# Patient Record
Sex: Male | Born: 1950
Health system: Southern US, Community
[De-identification: ages and names within clinical notes are randomized; demographics above are authoritative.]

## PROBLEM LIST (undated history)

## (undated) DIAGNOSIS — I10 Essential (primary) hypertension: Secondary | ICD-10-CM

## (undated) HISTORY — DX: Essential (primary) hypertension: I10

---

## 1998-02-19 ENCOUNTER — Emergency Department (HOSPITAL_COMMUNITY): Admission: EM | Admit: 1998-02-19 | Discharge: 1998-02-19 | Payer: Self-pay | Admitting: Emergency Medicine

## 1998-03-19 ENCOUNTER — Emergency Department (HOSPITAL_COMMUNITY): Admission: EM | Admit: 1998-03-19 | Discharge: 1998-03-19 | Payer: Self-pay | Admitting: Emergency Medicine

## 2000-01-12 ENCOUNTER — Emergency Department (HOSPITAL_COMMUNITY): Admission: EM | Admit: 2000-01-12 | Discharge: 2000-01-12 | Payer: Self-pay | Admitting: Internal Medicine

## 2002-04-17 ENCOUNTER — Emergency Department (HOSPITAL_COMMUNITY): Admission: EM | Admit: 2002-04-17 | Discharge: 2002-04-17 | Payer: Self-pay | Admitting: Emergency Medicine

## 2004-08-10 ENCOUNTER — Emergency Department (HOSPITAL_COMMUNITY): Admission: EM | Admit: 2004-08-10 | Discharge: 2004-08-10 | Payer: Self-pay | Admitting: Emergency Medicine

## 2014-04-23 ENCOUNTER — Other Ambulatory Visit: Payer: Self-pay | Admitting: Family Medicine

## 2014-04-29 ENCOUNTER — Ambulatory Visit: Payer: Self-pay | Attending: Internal Medicine | Admitting: Internal Medicine

## 2014-04-29 ENCOUNTER — Encounter: Payer: Self-pay | Admitting: Internal Medicine

## 2014-04-29 VITALS — BP 139/68 | HR 70 | Temp 98.0°F | Resp 20 | Ht 68.0 in | Wt 186.6 lb

## 2014-04-29 DIAGNOSIS — R300 Dysuria: Secondary | ICD-10-CM | POA: Insufficient documentation

## 2014-04-29 DIAGNOSIS — I10 Essential (primary) hypertension: Secondary | ICD-10-CM | POA: Insufficient documentation

## 2014-04-29 MED ORDER — HYDROCHLOROTHIAZIDE 25 MG PO TABS: 25.0000 mg | ORAL_TABLET | Freq: Every day | ORAL | Status: DC

## 2014-04-29 MED ORDER — DOXYCYCLINE HYCLATE 100 MG PO TABS: 100.0000 mg | ORAL_TABLET | Freq: Two times a day (BID) | ORAL | Status: DC

## 2014-04-29 MED ORDER — AMLODIPINE BESYLATE 2.5 MG PO TABS: 2.5000 mg | ORAL_TABLET | Freq: Every day | ORAL | Status: DC

## 2014-04-29 MED ORDER — CEFTRIAXONE SODIUM 1 G IJ SOLR
250.0000 mg | Freq: Once | INTRAMUSCULAR | Status: AC
  Administered 2014-04-29: 250 mg via INTRAMUSCULAR

## 2014-04-29 NOTE — Progress Notes (Signed)
Patient ID: COLBERT CURENTON, male   DOB: Apr 02, 1951, 63 y.o.   MRN: [id number]   Koda Routon, is a 64 y.o. male  JYN:[id number]  [id number]:[id number]  DOB - 11/08/1950  CC:  Chief Complaint  Patient presents with  . Establish Care  . Hypertension       HPI: Hanley Woerner is a 63 y.o. male here today to establish medical care.  Patient reports that he has a past medical history of hypertension and has been taking norvasc 2.5 mg and 25 mg of HCTZ.  Patient reports that he did well with his medication.  He c/o of a clear discharge, pain, and burning with bowel movements.   Patient reports that he has clear discharge with urination and pain as well. Patient has not had a colonoscopy but reports that he is unable to afford it at this time.  Patient reports that he went to health department last month and was given two pills to take but it did not clear his discharge.      Patient has No headache, No chest pain, No abdominal pain - No Nausea, No new weakness tingling or numbness, No Cough - SOB.  No Known Allergies Past Medical History  Diagnosis Date  . Hypertension    No current outpatient prescriptions on file prior to visit.   No current facility-administered medications on file prior to visit.   Family History  Problem Relation Age of Onset  . Hypertension Mother    History   Social History  . Marital Status: Single    Spouse Name: N/A    Number of Children: N/A  . Years of Education: N/A   Occupational History  . Not on file.   Social History Main Topics  . Smoking status: Never Smoker   . Smokeless tobacco: Not on file  . Alcohol Use: 3.0 oz/week    6 drink(s) per week  . Drug Use: No  . Sexual Activity: Not on file   Other Topics Concern  . Not on file   Social History Narrative  . No narrative on file   Review of Systems  HENT: Negative.   Eyes: Negative.   Respiratory: Negative.   Cardiovascular: Negative.   Gastrointestinal: Negative.     Genitourinary: Negative.   Musculoskeletal: Negative.   Neurological: Negative.       Objective:   Filed Vitals:   04/29/14 1130  BP: 139/68  Pulse: 70  Temp: 98 F (36.7 C)  Resp: 20   Physical Exam  Vitals reviewed. Constitutional: He is oriented to person, place, and time. He appears well-nourished.  HENT:  Right Ear: External ear normal.  Left Ear: External ear normal.  Mouth/Throat: Oropharynx is clear and moist.  Eyes: EOM are normal. Pupils are equal, round, and reactive to light.  Neck: Normal range of motion. Neck supple.  Cardiovascular: Normal rate, regular rhythm and normal heart sounds.   Pulmonary/Chest: Effort normal and breath sounds normal.  Abdominal: Soft. Bowel sounds are normal.  Genitourinary: Penis normal. Right testis shows no tenderness. Left testis shows no tenderness. No discharge (no discharge noted. Fishy odor noted) found.  Musculoskeletal: Normal range of motion. He exhibits no edema and no tenderness.  Lymphadenopathy: No inguinal adenopathy noted on the right or left side.  Neurological: He is alert and oriented to person, place, and time. He has normal reflexes.  Skin: He is not diaphoretic.     No results found for this basename: WBC, HGB, HCT, MCV, PLT  No results found for this basename: CREATININE, BUN, NA, K, CL, CO2    No results found for this basename: HGBA1C   Lipid Panel  No results found for this basename: chol, trig, hdl, cholhdl, vldl, ldlcalc       Assessment and plan:   [name] was seen today for establish care and hypertension.  Diagnoses and associated orders for this visit:  Essential hypertension - amLODipine (NORVASC) 2.5 MG tablet; Take 1 tablet (2.5 mg total) by mouth daily. - hydrochlorothiazide (HYDRODIURIL) 25 MG tablet; Take 1 tablet (25 mg total) by mouth daily. - Lipid panel; Future - CBC; Future - COMPLETE METABOLIC PANEL WITH GFR; Future - TSH; Future  Dysuria - cefTRIAXone (ROCEPHIN)  injection 250 mg; Inject 0.25 g (250 mg total) into the muscle once. - doxycycline (VIBRA-TABS) 100 MG tablet; Take 1 tablet (100 mg total) by mouth 2 (two) times daily. - Cytology (oral, anal, urethral) ancillary only - HIV antibody (with reflex); Future - RPR; Future   Return in about 1 week (around 05/06/2014) for Lab Visit and 6 mo PCP for HTN.    Holland CommonsKECK, Tavonte Seybold, NP-C [facility] [phone] 04/29/2014, 11:50 AM

## 2014-04-29 NOTE — Patient Instructions (Signed)
DASH Eating Plan  DASH stands for "Dietary Approaches to Stop Hypertension." The DASH eating plan is a healthy eating plan that has been shown to reduce high blood pressure (hypertension). Additional health benefits may include reducing the risk of type 2 diabetes mellitus, heart disease, and stroke. The DASH eating plan may also help with weight loss.  WHAT DO I NEED TO KNOW ABOUT THE DASH EATING PLAN?  For the DASH eating plan, you will follow these general guidelines:  · Choose foods with a percent daily value for sodium of less than 5% (as listed on the food label).  · Use salt-free seasonings or herbs instead of table salt or sea salt.  · Check with your health care provider or pharmacist before using salt substitutes.  · Eat lower-sodium products, often labeled as "lower sodium" or "no salt added."  · Eat fresh foods.  · Eat more vegetables, fruits, and low-fat dairy products.  · Choose whole grains. Look for the word "whole" as the first word in the ingredient list.  · Choose fish and skinless chicken or turkey more often than red meat. Limit fish, poultry, and meat to 6 oz (170 g) each day.  · Limit sweets, desserts, sugars, and sugary drinks.  · Choose heart-healthy fats.  · Limit cheese to 1 oz (28 g) per day.  · Eat more home-cooked food and less restaurant, buffet, and fast food.  · Limit fried foods.  · Cook foods using methods other than frying.  · Limit canned vegetables. If you do use them, rinse them well to decrease the sodium.  · When eating at a restaurant, ask that your food be prepared with less salt, or no salt if possible.  WHAT FOODS CAN I EAT?  Seek help from a dietitian for individual calorie needs.  Grains  Whole grain or whole wheat bread. Brown rice. Whole grain or whole wheat pasta. Quinoa, bulgur, and whole grain cereals. Low-sodium cereals. Corn or whole wheat flour tortillas. Whole grain cornbread. Whole grain crackers. Low-sodium crackers.  Vegetables  Fresh or frozen vegetables  (raw, steamed, roasted, or grilled). Low-sodium or reduced-sodium tomato and vegetable juices. Low-sodium or reduced-sodium tomato sauce and paste. Low-sodium or reduced-sodium canned vegetables.   Fruits  All fresh, canned (in natural juice), or frozen fruits.  Meat and Other Protein Products  Ground beef (85% or leaner), grass-fed beef, or beef trimmed of fat. Skinless chicken or turkey. Ground chicken or turkey. Pork trimmed of fat. All fish and seafood. Eggs. Dried beans, peas, or lentils. Unsalted nuts and seeds. Unsalted canned beans.  Dairy  Low-fat dairy products, such as skim or 1% milk, 2% or reduced-fat cheeses, low-fat ricotta or cottage cheese, or plain low-fat yogurt. Low-sodium or reduced-sodium cheeses.  Fats and Oils  Tub margarines without trans fats. Light or reduced-fat mayonnaise and salad dressings (reduced sodium). Avocado. Safflower, olive, or canola oils. Natural peanut or almond butter.  Other  Unsalted popcorn and pretzels.  The items listed above may not be a complete list of recommended foods or beverages. Contact your dietitian for more options.  WHAT FOODS ARE NOT RECOMMENDED?  Grains  White bread. White pasta. White rice. Refined cornbread. Bagels and croissants. Crackers that contain trans fat.  Vegetables  Creamed or fried vegetables. Vegetables in a cheese sauce. Regular canned vegetables. Regular canned tomato sauce and paste. Regular tomato and vegetable juices.  Fruits  Dried fruits. Canned fruit in light or heavy syrup. Fruit juice.  Meat and Other Protein   Products  Fatty cuts of meat. Ribs, chicken wings, bacon, sausage, bologna, salami, chitterlings, fatback, hot dogs, bratwurst, and packaged luncheon meats. Salted nuts and seeds. Canned beans with salt.  Dairy  Whole or 2% milk, cream, half-and-half, and cream cheese. Whole-fat or sweetened yogurt. Full-fat cheeses or blue cheese. Nondairy creamers and whipped toppings. Processed cheese, cheese spreads, or cheese  curds.  Condiments  Onion and garlic salt, seasoned salt, table salt, and sea salt. Canned and packaged gravies. Worcestershire sauce. Tartar sauce. Barbecue sauce. Teriyaki sauce. Soy sauce, including reduced sodium. Steak sauce. Fish sauce. Oyster sauce. Cocktail sauce. Horseradish. Ketchup and mustard. Meat flavorings and tenderizers. Bouillon cubes. Hot sauce. Tabasco sauce. Marinades. Taco seasonings. Relishes.  Fats and Oils  Butter, stick margarine, lard, shortening, ghee, and bacon fat. Coconut, palm kernel, or palm oils. Regular salad dressings.  Other  Pickles and olives. Salted popcorn and pretzels.  The items listed above may not be a complete list of foods and beverages to avoid. Contact your dietitian for more information.  WHERE CAN I FIND MORE INFORMATION?  National Heart, Lung, and Blood Institute: www.nhlbi.nih.gov/health/health-topics/topics/dash/  Document Released: 09/16/2011 Document Revised: 10/02/2013 Document Reviewed: 08/01/2013  ExitCare® Patient Information ©2015 [employer]. This information is not intended to replace advice given to you by your health care provider. Make sure you discuss any questions you have with your health care provider.

## 2014-04-29 NOTE — Progress Notes (Signed)
Patient presents to establish care for HTN Ran out of meds 2 days ago. Requesting med refills.  States 1-2 month history of burning with bowel movement as well as clear discharge

## 2014-05-01 ENCOUNTER — Telehealth: Payer: Self-pay | Admitting: *Deleted

## 2014-05-01 NOTE — Telephone Encounter (Signed)
Message copied by Fredderick SeveranceUCATTE, Vincent Streater L on Wed May 01, 2014 11:30 AM ------      Message from: Holland CommonsKECK, VALERIE A      Created: Tue Apr 30, 2014  7:15 PM       Let patient know he was negative for GC, chlamydia, and Trichomonas. He has been treated for everything regardless on last visit. ------

## 2014-05-01 NOTE — Telephone Encounter (Signed)
Patient notified of lab results

## 2014-05-06 ENCOUNTER — Ambulatory Visit: Payer: Self-pay | Attending: Internal Medicine | Admitting: *Deleted

## 2014-05-06 DIAGNOSIS — R300 Dysuria: Secondary | ICD-10-CM

## 2014-05-06 DIAGNOSIS — I10 Essential (primary) hypertension: Secondary | ICD-10-CM

## 2014-05-06 LAB — COMPLETE METABOLIC PANEL WITH GFR
ALK PHOS: 72 U/L (ref 39–117)
ALT: 35 U/L (ref 0–53)
AST: 20 U/L (ref 0–37)
Albumin: 3.9 g/dL (ref 3.5–5.2)
BUN: 12 mg/dL (ref 6–23)
CO2: 29 mEq/L (ref 19–32)
Calcium: 9.4 mg/dL (ref 8.4–10.5)
Chloride: 102 mEq/L (ref 96–112)
Creat: 1.06 mg/dL (ref 0.50–1.35)
GFR, EST NON AFRICAN AMERICAN: 75 mL/min
GFR, Est African American: 87 mL/min
GLUCOSE: 107 mg/dL — AB (ref 70–99)
Potassium: 3.7 mEq/L (ref 3.5–5.3)
SODIUM: 140 meq/L (ref 135–145)
Total Bilirubin: 0.4 mg/dL (ref 0.2–1.2)
Total Protein: 6.9 g/dL (ref 6.0–8.3)

## 2014-05-06 LAB — LIPID PANEL
CHOL/HDL RATIO: 4 ratio
CHOLESTEROL: 185 mg/dL (ref 0–200)
HDL: 46 mg/dL (ref >39)
LDL Cholesterol: 109 mg/dL — ABNORMAL HIGH (ref 0–99)
TRIGLYCERIDES: 149 mg/dL (ref <150)
VLDL: 30 mg/dL (ref 0–40)

## 2014-05-06 LAB — CBC
HEMATOCRIT: 37.9 % — AB (ref 39.0–52.0)
HEMOGLOBIN: 13.4 g/dL (ref 13.0–17.0)
MCH: 32.3 pg (ref 26.0–34.0)
MCHC: 35.4 g/dL (ref 30.0–36.0)
MCV: 91.3 fL (ref 78.0–100.0)
Platelets: 280 10*3/uL (ref 150–400)
RBC: 4.15 MIL/uL — ABNORMAL LOW (ref 4.22–5.81)
RDW: 12.6 % (ref 11.5–15.5)
WBC: 4.5 10*3/uL (ref 4.0–10.5)

## 2014-05-06 LAB — TSH: TSH: 0.718 u[IU]/mL (ref 0.350–4.500)

## 2014-05-07 LAB — RPR

## 2014-05-07 LAB — HIV ANTIBODY (ROUTINE TESTING W REFLEX): HIV 1&2 Ab, 4th Generation: NONREACTIVE

## 2014-05-09 ENCOUNTER — Telehealth: Payer: Self-pay | Admitting: Internal Medicine

## 2014-05-09 NOTE — Telephone Encounter (Signed)
Returned patient's call. Informed patient his HIV and RPR is negative. Patient educated about the need to use condoms every time he has intercourse. Patient also notified his other labs are normal except cholesterol is slightly elevated. Educated patient  on diet and exercise. Patient instructed about a low fat diet and exercising at least 3 days a week. Patient verbalized he needs a copy of his test results [facility] Patient informed he can come and pick up his results. Patient verbalized understanding. Annamaria Hellingose,Chinelo Benn Renee, RN

## 2014-05-09 NOTE — Telephone Encounter (Signed)
Pt. Came in requesting a copy of lab results from 05/06/14...Michael Mooney.please call patient with results as well as when he can come in to pick up copy.

## 2014-05-27 ENCOUNTER — Telehealth: Payer: Self-pay | Admitting: Internal Medicine

## 2014-05-27 NOTE — Telephone Encounter (Signed)
Pt has come in today to see if he can get a call from the nurse concerning his last lab results; please f/u with patients via mobile phone

## 2014-05-29 ENCOUNTER — Telehealth: Payer: Self-pay | Admitting: Internal Medicine

## 2014-05-29 NOTE — Telephone Encounter (Signed)
Pt. Called to concerning his blood work results. Please f/u with pt.

## 2014-05-30 NOTE — Telephone Encounter (Signed)
Spoke with patient. Patient does have a copy of all his labs. Annamaria Hellingose,Arayna Illescas Renee, RN

## 2014-07-11 ENCOUNTER — Ambulatory Visit: Payer: Self-pay | Admitting: Internal Medicine

## 2014-07-16 ENCOUNTER — Encounter: Payer: Self-pay | Admitting: Internal Medicine

## 2014-07-16 NOTE — Progress Notes (Signed)
Pt came to the office for his appointment. He came late and we were unable to see him. I spoke with the pt and he claimed to have palpitations for a week.  I instructed the pt that he should go straight to the UC. I let him know that this was a serious matter and that he really needed to go to the UC right now.

## 2014-07-17 ENCOUNTER — Encounter: Payer: Self-pay | Admitting: *Deleted

## 2014-07-17 NOTE — Progress Notes (Signed)
Pt came into the office yesterday and was too late for his appointment so we were unable to see him. I did question why he was coming into office that day and he stated that he was having palpitations. I instructed the pt that he needed to go the the UC right away. Pt states that he would go and I told him that this was a serious matter. He said that he would go straight there.

## 2014-07-22 ENCOUNTER — Ambulatory Visit: Payer: Self-pay | Attending: Internal Medicine | Admitting: Internal Medicine

## 2014-07-22 ENCOUNTER — Encounter: Payer: Self-pay | Admitting: Internal Medicine

## 2014-07-22 VITALS — BP 138/78 | HR 83 | Temp 98.6°F | Ht 68.0 in | Wt 184.0 lb

## 2014-07-22 DIAGNOSIS — Z005 Encounter for examination of potential donor of organ and tissue: Secondary | ICD-10-CM | POA: Insufficient documentation

## 2014-07-22 DIAGNOSIS — R000 Tachycardia, unspecified: Secondary | ICD-10-CM | POA: Insufficient documentation

## 2014-07-22 DIAGNOSIS — Z2821 Immunization not carried out because of patient refusal: Secondary | ICD-10-CM | POA: Insufficient documentation

## 2014-07-22 NOTE — Progress Notes (Signed)
Patient ID: Michael LentCharles J Mooney, male   DOB: 09/28/1951, 63 y.o.   MRN: [id number]  CC: High pulse rate  HPI:  Patient states that the past two times he has been to donate plasma he has had a high pulse rate.  He was told that he needs to be evaluated by his PCP before donation can take place.  He admits to drinking coffee and walking from the bus stop to donation location before his vitals are taken.  He reports that he usually checks his blood pressure at home and they usually range from the 118 to 120s systolic.    No Known Allergies Past Medical History  Diagnosis Date  . Hypertension    Current Outpatient Prescriptions on File Prior to Visit  Medication Sig Dispense Refill  . amLODipine (NORVASC) 2.5 MG tablet Take 1 tablet (2.5 mg total) by mouth daily.  30 tablet  6  . doxycycline (VIBRA-TABS) 100 MG tablet Take 1 tablet (100 mg total) by mouth 2 (two) times daily.  14 tablet  0  . hydrochlorothiazide (HYDRODIURIL) 25 MG tablet Take 1 tablet (25 mg total) by mouth daily.  30 tablet  6   No current facility-administered medications on file prior to visit.   Family History  Problem Relation Age of Onset  . Hypertension Mother    History   Social History  . Marital Status: Single    Spouse Name: N/A    Number of Children: N/A  . Years of Education: N/A   Occupational History  . Not on file.   Social History Main Topics  . Smoking status: Never Smoker   . Smokeless tobacco: Not on file  . Alcohol Use: 3.0 oz/week    6 drink(s) per week  . Drug Use: No  . Sexual Activity: Not on file   Other Topics Concern  . Not on file   Social History Narrative  . No narrative on file    Review of Systems  Eyes: Negative for blurred vision.  Respiratory: Negative.   Cardiovascular: Negative.   Neurological: Negative for dizziness and headaches.      Objective:   Filed Vitals:   07/22/14 1210  BP: 167/72  Pulse: 83  Temp: 98.6 F (37 C)   Physical Exam   Constitutional: He is oriented to person, place, and time.  Cardiovascular: Normal rate, regular rhythm and normal heart sounds.   No murmur heard. Pulmonary/Chest: Effort normal and breath sounds normal.  Neurological: He is alert and oriented to person, place, and time.  Skin: Skin is warm and dry.     Lab Results  Component Value Date   WBC 4.5 05/06/2014   HGB 13.4 05/06/2014   HCT 37.9* 05/06/2014   MCV 91.3 05/06/2014   PLT 280 05/06/2014   Lab Results  Component Value Date   CREATININE 1.06 05/06/2014   BUN 12 05/06/2014   NA 140 05/06/2014   K 3.7 05/06/2014   CL 102 05/06/2014   CO2 29 05/06/2014    No results found for this basename: HGBA1C   Lipid Panel     Component Value Date/Time   CHOL 185 05/06/2014 1001   TRIG 149 05/06/2014 1001   HDL 46 05/06/2014 1001   CHOLHDL 4.0 05/06/2014 1001   VLDL 30 05/06/2014 1001   LDLCALC 109* 05/06/2014 1001       Assessment and plan:   [name] was seen today for high pulse rate.  Diagnoses and associated orders for this visit:  Tachycardia Patient's heart rate is within normal limits today.  Patient's heart rate is within normal limits on each visit. Patient will sustain from drinking coffee on days of donation and sit for at least 10 minutes before vitals are taken. Patient cleared to continue with plasma donation  Refused influenza vaccine Educated patient on benefits of influenza vaccination   Return if symptoms worsen or fail to improve.       Holland CommonsKECK, VALERIE, NP-C [facility] [phone] 07/22/2014, 12:19 PM

## 2014-07-22 NOTE — Progress Notes (Signed)
patient says that the last couple of times that he has gone to give blood his pulse rate has been 102 or 103 and he was unable to give,  Today it is 83.

## 2014-12-16 ENCOUNTER — Other Ambulatory Visit: Payer: Self-pay | Admitting: Internal Medicine

## 2014-12-17 ENCOUNTER — Other Ambulatory Visit: Payer: Self-pay | Admitting: Internal Medicine

## 2014-12-18 ENCOUNTER — Ambulatory Visit: Payer: Self-pay | Attending: Internal Medicine | Admitting: Internal Medicine

## 2014-12-18 ENCOUNTER — Encounter: Payer: Self-pay | Admitting: Internal Medicine

## 2014-12-18 VITALS — BP 121/73 | HR 72 | Temp 98.6°F | Resp 16 | Ht 68.0 in | Wt 187.0 lb

## 2014-12-18 DIAGNOSIS — I10 Essential (primary) hypertension: Secondary | ICD-10-CM

## 2014-12-18 MED ORDER — HYDROCHLOROTHIAZIDE 25 MG PO TABS: 25.0000 mg | ORAL_TABLET | Freq: Every day | ORAL | Status: DC

## 2014-12-18 MED ORDER — AMLODIPINE BESYLATE 5 MG PO TABS: 2.5000 mg | ORAL_TABLET | Freq: Every day | ORAL | Status: DC

## 2014-12-18 NOTE — Patient Instructions (Signed)
DASH Eating Plan °DASH stands for "Dietary Approaches to Stop Hypertension." The DASH eating plan is a healthy eating plan that has been shown to reduce high blood pressure (hypertension). Additional health benefits may include reducing the risk of type 2 diabetes mellitus, heart disease, and stroke. The DASH eating plan may also help with weight loss. °WHAT DO I NEED TO KNOW ABOUT THE DASH EATING PLAN? °For the DASH eating plan, you will follow these general guidelines: °· Choose foods with a percent daily value for sodium of less than 5% (as listed on the food label). °· Use salt-free seasonings or herbs instead of table salt or sea salt. °· Check with your health care provider or pharmacist before using salt substitutes. °· Eat lower-sodium products, often labeled as "lower sodium" or "no salt added." °· Eat fresh foods. °· Eat more vegetables, fruits, and low-fat dairy products. °· Choose whole grains. Look for the word "whole" as the first word in the ingredient list. °· Choose fish and skinless chicken or turkey more often than red meat. Limit fish, poultry, and meat to 6 oz (170 g) each day. °· Limit sweets, desserts, sugars, and sugary drinks. °· Choose heart-healthy fats. °· Limit cheese to 1 oz (28 g) per day. °· Eat more home-cooked food and less restaurant, buffet, and fast food. °· Limit fried foods. °· Cook foods using methods other than frying. °· Limit canned vegetables. If you do use them, rinse them well to decrease the sodium. °· When eating at a restaurant, ask that your food be prepared with less salt, or no salt if possible. °WHAT FOODS CAN I EAT? °Seek help from a dietitian for individual calorie needs. °Grains °Whole grain or whole wheat bread. Brown rice. Whole grain or whole wheat pasta. Quinoa, bulgur, and whole grain cereals. Low-sodium cereals. Corn or whole wheat flour tortillas. Whole grain cornbread. Whole grain crackers. Low-sodium crackers. °Vegetables °Fresh or frozen vegetables  (raw, steamed, roasted, or grilled). Low-sodium or reduced-sodium tomato and vegetable juices. Low-sodium or reduced-sodium tomato sauce and paste. Low-sodium or reduced-sodium canned vegetables.  °Fruits °All fresh, canned (in natural juice), or frozen fruits. °Meat and Other Protein Products °Ground beef (85% or leaner), grass-fed beef, or beef trimmed of fat. Skinless chicken or turkey. Ground chicken or turkey. Pork trimmed of fat. All fish and seafood. Eggs. Dried beans, peas, or lentils. Unsalted nuts and seeds. Unsalted canned beans. °Dairy °Low-fat dairy products, such as skim or 1% milk, 2% or reduced-fat cheeses, low-fat ricotta or cottage cheese, or plain low-fat yogurt. Low-sodium or reduced-sodium cheeses. °Fats and Oils °Tub margarines without trans fats. Light or reduced-fat mayonnaise and salad dressings (reduced sodium). Avocado. Safflower, olive, or canola oils. Natural peanut or almond butter. °Other °Unsalted popcorn and pretzels. °The items listed above may not be a complete list of recommended foods or beverages. Contact your dietitian for more options. °WHAT FOODS ARE NOT RECOMMENDED? °Grains °White bread. White pasta. White rice. Refined cornbread. Bagels and croissants. Crackers that contain trans fat. °Vegetables °Creamed or fried vegetables. Vegetables in a cheese sauce. Regular canned vegetables. Regular canned tomato sauce and paste. Regular tomato and vegetable juices. °Fruits °Dried fruits. Canned fruit in light or heavy syrup. Fruit juice. °Meat and Other Protein Products °Fatty cuts of meat. Ribs, chicken wings, bacon, sausage, bologna, salami, chitterlings, fatback, hot dogs, bratwurst, and packaged luncheon meats. Salted nuts and seeds. Canned beans with salt. °Dairy °Whole or 2% milk, cream, half-and-half, and cream cheese. Whole-fat or sweetened yogurt. Full-fat   cheeses or blue cheese. Nondairy creamers and whipped toppings. Processed cheese, cheese spreads, or cheese  curds. °Condiments °Onion and garlic salt, seasoned salt, table salt, and sea salt. Canned and packaged gravies. Worcestershire sauce. Tartar sauce. Barbecue sauce. Teriyaki sauce. Soy sauce, including reduced sodium. Steak sauce. Fish sauce. Oyster sauce. Cocktail sauce. Horseradish. Ketchup and mustard. Meat flavorings and tenderizers. Bouillon cubes. Hot sauce. Tabasco sauce. Marinades. Taco seasonings. Relishes. °Fats and Oils °Butter, stick margarine, lard, shortening, ghee, and bacon fat. Coconut, palm kernel, or palm oils. Regular salad dressings. °Other °Pickles and olives. Salted popcorn and pretzels. °The items listed above may not be a complete list of foods and beverages to avoid. Contact your dietitian for more information. °WHERE CAN I FIND MORE INFORMATION? °National Heart, Lung, and Blood Institute: www.nhlbi.nih.gov/health/health-topics/topics/dash/ °Document Released: 09/16/2011 Document Revised: 02/11/2014 Document Reviewed: 08/01/2013 °ExitCare® Patient Information ©2015 [employer]. This information is not intended to replace advice given to you by your health care provider. Make sure you discuss any questions you have with your health care provider. ° °

## 2014-12-18 NOTE — Progress Notes (Signed)
Pt is here following up on his HTN. Pt has no C.C. Today. 

## 2014-12-18 NOTE — Progress Notes (Signed)
Patient ID: Michael Mooney, male   DOB: 10/01/1951, 64 y.o.   MRN: [id number] Subjective:  Michael Mooney is a 64 y.o. male with hypertension. Current Outpatient Prescriptions  Medication Sig Dispense Refill  . amLODipine (NORVASC) 5 MG tablet TAKE 1/2 TABLET BY MOUTH DAILY 15 tablet 6  . hydrochlorothiazide (HYDRODIURIL) 25 MG tablet TAKE 1 TABLET BY MOUTH DAILY. 30 tablet 6  . doxycycline (VIBRA-TABS) 100 MG tablet Take 1 tablet (100 mg total) by mouth 2 (two) times daily. (Patient not taking: Reported on 12/18/2014) 14 tablet 0   No current facility-administered medications for this visit.    Hypertension ROS: taking medications as instructed, no medication side effects noted, no TIA's, no chest pain on exertion, no dyspnea on exertion, no swelling of ankles and no palpitations.  New concerns: NONE  Objective:  BP 121/73 mmHg  Pulse 72  Temp(Src) 98.6 F (37 C) (Oral)  Resp 16  Ht 5\' 8"  (1.727 m)  Wt 187 lb (84.823 kg)  BMI 28.44 kg/m2  SpO2 95%  Appearance alert, well appearing, and in no distress and oriented to person, place, and time. General exam BP noted to be well controlled today in office, S1, S2 normal, no gallop, no murmur, chest clear, no JVD, no HSM, no edema, CVS exam  - normal rate, regular rhythm, normal S1, S2, no murmurs, rubs, clicks or gallops, normal bilateral carotid upstroke without bruits, no JVD.  Lab review: labs are reviewed, up to date and normal.   Assessment:   Hypertension well controlled.   Plan:  Current treatment plan is effective, no change in therapy. Reviewed diet, exercise and weight control. Cardiovascular risk and specific lipid/LDL goals reviewed. Follow up: 6 months and as needed.Marland Kitchen.  Holland CommonsKECK, VALERIE, NP 12/18/2014 3:21 PM

## 2015-01-08 ENCOUNTER — Ambulatory Visit: Payer: Self-pay

## 2015-01-10 ENCOUNTER — Ambulatory Visit: Payer: Self-pay

## 2015-10-30 MED FILL — HYDROCHLOROTHIAZIDE 25 MG T: 25 | 30 days supply | Qty: 30 | Fill #3

## 2015-10-30 MED FILL — AMLODIPINE BESYLATE 5 MG TA: 5 | 30 days supply | Qty: 15 | Fill #9

## 2015-11-03 ENCOUNTER — Other Ambulatory Visit: Payer: Self-pay | Admitting: Internal Medicine

## 2015-11-28 MED FILL — AMLODIPINE BESYLATE 5 MG TA: 5 | 30 days supply | Qty: 15 | Fill #10

## 2015-11-28 MED FILL — HYDROCHLOROTHIAZIDE 25 MG T: 25 | 30 days supply | Qty: 30 | Fill #4

## 2016-01-01 ENCOUNTER — Other Ambulatory Visit: Payer: Self-pay | Admitting: Internal Medicine

## 2016-01-02 MED FILL — AMLODIPINE BESYLATE 5 MG TA: 5 | 30 days supply | Qty: 15 | Fill #0

## 2016-01-02 MED FILL — HYDROCHLOROTHIAZIDE 25 MG T: 25 | 30 days supply | Qty: 30 | Fill #0

## 2016-02-02 ENCOUNTER — Other Ambulatory Visit: Payer: Self-pay | Admitting: Internal Medicine

## 2016-02-04 ENCOUNTER — Other Ambulatory Visit: Payer: Self-pay | Admitting: Internal Medicine

## 2016-02-18 ENCOUNTER — Ambulatory Visit: Payer: Self-pay | Attending: Family Medicine | Admitting: Family Medicine

## 2016-02-18 ENCOUNTER — Encounter: Payer: Self-pay | Admitting: Family Medicine

## 2016-02-18 VITALS — BP 146/75 | HR 76 | Temp 98.3°F | Resp 16 | Ht 68.0 in | Wt 182.0 lb

## 2016-02-18 DIAGNOSIS — Z1211 Encounter for screening for malignant neoplasm of colon: Secondary | ICD-10-CM

## 2016-02-18 DIAGNOSIS — I10 Essential (primary) hypertension: Secondary | ICD-10-CM | POA: Insufficient documentation

## 2016-02-18 DIAGNOSIS — Z79899 Other long term (current) drug therapy: Secondary | ICD-10-CM | POA: Insufficient documentation

## 2016-02-18 LAB — COMPLETE METABOLIC PANEL WITH GFR
ALT: 29 U/L (ref 9–46)
AST: 26 U/L (ref 10–35)
Albumin: 4.1 g/dL (ref 3.6–5.1)
Alkaline Phosphatase: 63 U/L (ref 40–115)
BILIRUBIN TOTAL: 0.8 mg/dL (ref 0.2–1.2)
BUN: 7 mg/dL (ref 7–25)
CALCIUM: 9.2 mg/dL (ref 8.6–10.3)
CO2: 28 mmol/L (ref 20–31)
CREATININE: 0.89 mg/dL (ref 0.70–1.25)
Chloride: 96 mmol/L — ABNORMAL LOW (ref 98–110)
GFR, Est Non African American: >89 mL/min (ref >=60)
Glucose, Bld: 104 mg/dL — ABNORMAL HIGH (ref 65–99)
Potassium: 4 mmol/L (ref 3.5–5.3)
Sodium: 136 mmol/L (ref 135–146)
TOTAL PROTEIN: 7.2 g/dL (ref 6.1–8.1)

## 2016-02-18 LAB — LIPID PANEL
CHOLESTEROL: 137 mg/dL (ref 125–200)
HDL: 54 mg/dL (ref >=40)
LDL Cholesterol: 31 mg/dL (ref <130)
TRIGLYCERIDES: 258 mg/dL — AB (ref <150)
Total CHOL/HDL Ratio: 2.5 Ratio (ref <=5.0)
VLDL: 52 mg/dL — ABNORMAL HIGH (ref <30)

## 2016-02-18 MED ORDER — AMLODIPINE BESYLATE 5 MG PO TABS: ORAL_TABLET | ORAL | Status: DC

## 2016-02-18 MED ORDER — HYDROCHLOROTHIAZIDE 25 MG PO TABS: ORAL_TABLET | ORAL | Status: DC

## 2016-02-18 MED FILL — ?AMLODIPINE BESYLATE 5 MG T: 5 | 30 days supply | Qty: 15 | Fill #0

## 2016-02-18 MED FILL — ?HYDROCHLOROTHIAZIDE 25 MG: 25 MG | 30 days supply | Qty: 30 | Fill #0

## 2016-02-18 NOTE — Progress Notes (Signed)
Subjective:  Patient ID: Michael Mooney, male    DOB: Dec 22, 1950  Age: 65 y.o. MRN: [id number]  CC: Hypertension and Establish Care   HPI Michael Mooney is a 65 year old male with history of hypertension previously followed by the nurse practitioner who comes into the clinic to establish care with me. He was last seen a year ago and has been out of all his antihypertensives hent and mildly elevated blood pressure and is requesting refills.  He has no complaints today and is not up-to-date on his colonoscopy.  Outpatient Prescriptions Prior to Visit  Medication Sig Dispense Refill  . amLODipine (NORVASC) 5 MG tablet TAKE 1/2 TABLET BY MOUTH DAILY. MUST HAVE OFFICE VISIT FOR REFILLS 15 tablet 0  . hydrochlorothiazide (HYDRODIURIL) 25 MG tablet TAKE 1 TABLET BY MOUTH DAILY. MUST HAVE OFFICE VISIT FOR REFILLS 30 tablet 0  . doxycycline (VIBRA-TABS) 100 MG tablet Take 1 tablet (100 mg total) by mouth 2 (two) times daily. (Patient not taking: Reported on 12/18/2014) 14 tablet 0   No facility-administered medications prior to visit.    ROS Review of Systems  Constitutional: Negative for activity change and appetite change.  HENT: Negative for sinus pressure and sore throat.   Eyes: Negative for visual disturbance.  Respiratory: Negative for cough, chest tightness and shortness of breath.   Cardiovascular: Negative for chest pain and leg swelling.  Gastrointestinal: Negative for abdominal pain, diarrhea, constipation and abdominal distention.  Endocrine: Negative.   Genitourinary: Negative for dysuria.  Musculoskeletal: Negative for myalgias and joint swelling.  Skin: Negative for rash.  Allergic/Immunologic: Negative.   Neurological: Negative for weakness, light-headedness and numbness.  Psychiatric/Behavioral: Negative for suicidal ideas and dysphoric mood.    Objective:  BP 146/75 mmHg  Pulse 76  Temp(Src) 98.3 F (36.8 C) (Oral)  Resp 16  Ht  (1.727 m)  Wt 182 lb  (82.555 kg)  BMI 27.68 kg/m2  SpO2 98%  BP/Weight 02/18/2016 12/18/2014 07/22/2014  Systolic BP 146 121 138  Diastolic BP 75 73 78  Wt. (Lbs) 182 187 184  BMI 27.68 28.44 27.98      Physical Exam  Constitutional: He is oriented to person, place, and time. He appears well-developed and well-nourished.  Cardiovascular: Normal rate, normal heart sounds and intact distal pulses.   No murmur heard. Pulmonary/Chest: Effort normal and breath sounds normal. He has no wheezes. He has no rales. He exhibits no tenderness.  Abdominal: Soft. Bowel sounds are normal. He exhibits no distension and no mass. There is no tenderness.  Musculoskeletal: Normal range of motion.  Neurological: He is alert and oriented to person, place, and time.  Skin: Skin is warm and dry.  Psychiatric: He has a normal mood and affect.   CMP Latest Ref Rng 05/06/2014  Glucose 70 - 99 mg/dL 409(W)  BUN 6 - 23 mg/dL 12  Creatinine 1.19 - 1.47 mg/dL 8.29  Sodium 562 - 130 mEq/L 140  Potassium 3.5 - 5.3 mEq/L 3.7  Chloride 96 - 112 mEq/L 102  CO2 19 - 32 mEq/L 29  Calcium 8.4 - 10.5 mg/dL 9.4  Total Protein 6.0 - 8.3 g/dL 6.9  Total Bilirubin 0.2 - 1.2 mg/dL 0.4  Alkaline Phos 39 - 117 U/L 72  AST 0 - 37 U/L 20  ALT 0 - 53 U/L 35      Assessment & Plan:   1. Essential hypertension Mildly elevated due to the fact that he has been out of his antihypertensives which I  have refilled today Low-sodium, DASH diet - hydrochlorothiazide (HYDRODIURIL) 25 MG tablet; TAKE 1 TABLET BY MOUTH DAILY.  Dispense: 30 tablet; Refill: 5 - amLODipine (NORVASC) 5 MG tablet; TAKE 1/2 TABLET BY MOUTH DAILY.  Dispense: 30 tablet; Refill: 5 - Lipid panel - COMPLETE METABOLIC PANEL WITH GFR  2. HCM/ Special screening for malignant neoplasms, colon - Ambulatory referral to Gastroenterology   Meds ordered this encounter  Medications  . hydrochlorothiazide (HYDRODIURIL) 25 MG tablet    Sig: TAKE 1 TABLET BY MOUTH DAILY.    Dispense:   30 tablet    Refill:  5  . amLODipine (NORVASC) 5 MG tablet    Sig: TAKE 1/2 TABLET BY MOUTH DAILY.    Dispense:  30 tablet    Refill:  5    Follow-up: Return in about 6 months (around 08/20/2016) for Follow-up on hypertension.   Jaclyn ShaggyEnobong Amao MD

## 2016-02-18 NOTE — Patient Instructions (Signed)
Hypertension Hypertension, commonly called high blood pressure, is when the force of blood pumping through your arteries is too strong. Your arteries are the blood vessels that carry blood from your heart throughout your body. A blood pressure reading consists of a higher number over a lower number, such as 110/72. The higher number (systolic) is the pressure inside your arteries when your heart pumps. The lower number (diastolic) is the pressure inside your arteries when your heart relaxes. Ideally you want your blood pressure below 120/80. Hypertension forces your heart to work harder to pump blood. Your arteries may become narrow or stiff. Having untreated or uncontrolled hypertension can cause heart attack, stroke, kidney disease, and other problems. RISK FACTORS Some risk factors for high blood pressure are controllable. Others are not.  Risk factors you cannot control include:   Race. You may be at higher risk if you are African American.  Age. Risk increases with age.  Gender. Men are at higher risk than women before age 45 years. After age 65, women are at higher risk than men. Risk factors you can control include:  Not getting enough exercise or physical activity.  Being overweight.  Getting too much fat, sugar, calories, or salt in your diet.  Drinking too much alcohol. SIGNS AND SYMPTOMS Hypertension does not usually cause signs or symptoms. Extremely high blood pressure (hypertensive crisis) may cause headache, anxiety, shortness of breath, and nosebleed. DIAGNOSIS To check if you have hypertension, your health care provider will measure your blood pressure while you are seated, with your arm held at the level of your heart. It should be measured at least twice using the same arm. Certain conditions can cause a difference in blood pressure between your right and left arms. A blood pressure reading that is higher than normal on one occasion does not mean that you need treatment. If  it is not clear whether you have high blood pressure, you may be asked to return on a different day to have your blood pressure checked again. Or, you may be asked to monitor your blood pressure at home for 1 or more weeks. TREATMENT Treating high blood pressure includes making lifestyle changes and possibly taking medicine. Living a healthy lifestyle can help lower high blood pressure. You may need to change some of your habits. Lifestyle changes may include:  Following the DASH diet. This diet is high in fruits, vegetables, and whole grains. It is low in salt, red meat, and added sugars.  Keep your sodium intake below 2,300 mg per day.  Getting at least 30-45 minutes of aerobic exercise at least 4 times per week.  Losing weight if necessary.  Not smoking.  Limiting alcoholic beverages.  Learning ways to reduce stress. Your health care provider may prescribe medicine if lifestyle changes are not enough to get your blood pressure under control, and if one of the following is true:  You are 18-59 years of age and your systolic blood pressure is above 140.  You are 60 years of age or older, and your systolic blood pressure is above 150.  Your diastolic blood pressure is above 90.  You have diabetes, and your systolic blood pressure is over 140 or your diastolic blood pressure is over 90.  You have kidney disease and your blood pressure is above 140/90.  You have heart disease and your blood pressure is above 140/90. Your personal target blood pressure may vary depending on your medical conditions, your age, and other factors. HOME CARE INSTRUCTIONS    Have your blood pressure rechecked as directed by your health care provider.   Take medicines only as directed by your health care provider. Follow the directions carefully. Blood pressure medicines must be taken as prescribed. The medicine does not work as well when you skip doses. Skipping doses also puts you at risk for  problems.  Do not smoke.   Monitor your blood pressure at home as directed by your health care provider. SEEK MEDICAL CARE IF:   You think you are having a reaction to medicines taken.  You have recurrent headaches or feel dizzy.  You have swelling in your ankles.  You have trouble with your vision. SEEK IMMEDIATE MEDICAL CARE IF:  You develop a severe headache or confusion.  You have unusual weakness, numbness, or feel faint.  You have severe chest or abdominal pain.  You vomit repeatedly.  You have trouble breathing. MAKE SURE YOU:   Understand these instructions.  Will watch your condition.  Will get help right away if you are not doing well or get worse.   This information is not intended to replace advice given to you by your health care provider. Make sure you discuss any questions you have with your health care provider.   Document Released: 09/27/2005 Document Revised: 02/11/2015 Document Reviewed: 07/20/2013 Elsevier Interactive Patient Education 2016 Elsevier Inc.  

## 2016-02-18 NOTE — Progress Notes (Signed)
Patient's here to reestablish care and f/up HTN.  Patient denies any pain today.  Patient requesting med refill.  Patient reports taking BP meds before appt.

## 2016-02-18 NOTE — Addendum Note (Signed)
Addended by: Benjamin StainBENNETT-CURSE, Sanjana Folz L on: 02/18/2016 11:33 AM   Modules accepted: Kipp BroodSmartSet

## 2016-03-16 ENCOUNTER — Other Ambulatory Visit: Payer: Self-pay | Admitting: Internal Medicine

## 2016-03-23 MED FILL — ?HYDROCHLOROTHIAZIDE 25 MG: 25 MG | 25 days supply | Qty: 25 | Fill #0

## 2016-03-23 MED FILL — ?AMLODIPINE BESYLATE 5 MG T: 5 | 60 days supply | Qty: 30 | Fill #0

## 2016-04-14 MED FILL — HYDROCHLOROTHIAZIDE 25 MG T: 25 | 25 days supply | Qty: 25 | Fill #1

## 2016-05-12 MED FILL — ?AMLODIPINE BESYLATE 5 MG T: 5 | 60 days supply | Qty: 30 | Fill #1

## 2016-05-12 MED FILL — HYDROCHLOROTHIAZIDE 25 MG T: 25 | 25 days supply | Qty: 25 | Fill #2

## 2016-06-11 MED FILL — HYDROCHLOROTHIAZIDE 25 MG T: 25 | 25 days supply | Qty: 25 | Fill #3

## 2016-07-16 MED FILL — HYDROCHLOROTHIAZIDE 25 MG T: 25 | 25 days supply | Qty: 25 | Fill #4

## 2016-08-05 MED FILL — HYDROCHLOROTHIAZIDE 25 MG T: 25 | 25 days supply | Qty: 25 | Fill #5

## 2016-08-05 MED FILL — ?AMLODIPINE BESYLATE 5 MG T: 5 | 60 days supply | Qty: 30 | Fill #2

## 2016-09-09 MED FILL — HYDROCHLOROTHIAZIDE 25 MG T: 25 | 25 days supply | Qty: 25 | Fill #6

## 2016-10-06 ENCOUNTER — Other Ambulatory Visit: Payer: Self-pay | Admitting: Internal Medicine

## 2016-10-18 MED FILL — ?AMLODIPINE BESYLATE 5 MG T: 5 | 60 days supply | Qty: 30 | Fill #3

## 2016-10-18 MED FILL — HYDROCHLOROTHIAZIDE 25 MG T: 25 | 30 days supply | Qty: 30 | Fill #0

## 2016-11-15 MED FILL — HYDROCHLOROTHIAZIDE 25 MG T: 25 | 5 days supply | Qty: 5 | Fill #7

## 2016-11-22 ENCOUNTER — Other Ambulatory Visit: Payer: Self-pay | Admitting: Family Medicine

## 2016-11-29 ENCOUNTER — Other Ambulatory Visit: Payer: Self-pay | Admitting: Family Medicine

## 2016-11-29 DIAGNOSIS — I10 Essential (primary) hypertension: Secondary | ICD-10-CM

## 2016-11-30 MED FILL — HYDROCHLOROTHIAZIDE 25 MG T: 25 | 30 days supply | Qty: 30 | Fill #0

## 2016-11-30 NOTE — Telephone Encounter (Signed)
Pt. Came into facility requesting a refill on hydrochlorothiazide (HYDRODIURIL) 25 MG tablet  Pt. Was informed to call back Monday to schedule a f/u appt. With PCP. Pt. [facility]. Please f/u.

## 2016-12-29 MED FILL — ?AMLODIPINE BESYLATE 5 MG T: 5 | 60 days supply | Qty: 30 | Fill #4

## 2016-12-30 ENCOUNTER — Other Ambulatory Visit: Payer: Self-pay | Admitting: Family Medicine

## 2016-12-30 DIAGNOSIS — I10 Essential (primary) hypertension: Secondary | ICD-10-CM

## 2017-02-14 ENCOUNTER — Other Ambulatory Visit: Payer: Self-pay

## 2017-02-14 ENCOUNTER — Ambulatory Visit: Payer: Self-pay | Attending: Family Medicine | Admitting: Family Medicine

## 2017-02-14 VITALS — BP 149/71 | HR 89 | Temp 98.2°F | Resp 18 | Ht 68.0 in | Wt 182.8 lb

## 2017-02-14 DIAGNOSIS — Z79899 Other long term (current) drug therapy: Secondary | ICD-10-CM | POA: Insufficient documentation

## 2017-02-14 DIAGNOSIS — Z0000 Encounter for general adult medical examination without abnormal findings: Secondary | ICD-10-CM

## 2017-02-14 DIAGNOSIS — I10 Essential (primary) hypertension: Secondary | ICD-10-CM

## 2017-02-14 DIAGNOSIS — R9431 Abnormal electrocardiogram [ECG] [EKG]: Secondary | ICD-10-CM

## 2017-02-14 DIAGNOSIS — Z23 Encounter for immunization: Secondary | ICD-10-CM

## 2017-02-14 LAB — POCT UA - MICROALBUMIN
Creatinine, POC: 200 mg/dL
Microalbumin Ur, POC: 150 mg/L

## 2017-02-14 MED ORDER — HYDROCHLOROTHIAZIDE 25 MG PO TABS: 25.0000 mg | ORAL_TABLET | Freq: Every day | ORAL | 2 refills | Status: DC

## 2017-02-14 MED ORDER — AMLODIPINE BESYLATE 10 MG PO TABS: ORAL_TABLET | ORAL | 2 refills | Status: DC

## 2017-02-14 MED FILL — HYDROCHLOROTHIAZIDE 25 MG T: 25 | 30 days supply | Qty: 30 | Fill #0

## 2017-02-14 NOTE — Patient Instructions (Signed)
Hypertension °Hypertension is another name for high blood pressure. High blood pressure forces your heart to work harder to pump blood. This can cause problems over time. °There are two numbers in a blood pressure reading. There is a top number (systolic) over a bottom number (diastolic). It is best to have a blood pressure below 120/80. Healthy choices can help lower your blood pressure. You may need medicine to help lower your blood pressure if: °· Your blood pressure cannot be lowered with healthy choices. °· Your blood pressure is higher than 130/80. °Follow these instructions at home: °Eating and drinking  °· If directed, follow the DASH eating plan. This diet includes: °¨ Filling half of your plate at each meal with fruits and vegetables. °¨ Filling one quarter of your plate at each meal with whole grains. Whole grains include whole wheat pasta, brown rice, and whole grain bread. °¨ Eating or drinking low-fat dairy products, such as skim milk or low-fat yogurt. °¨ Filling one quarter of your plate at each meal with low-fat (lean) proteins. Low-fat proteins include fish, skinless chicken, eggs, beans, and tofu. °¨ Avoiding fatty meat, cured and processed meat, or chicken with skin. °¨ Avoiding premade or processed food. °· Eat less than 1,500 mg of salt (sodium) a day. °· Limit alcohol use to no more than 1 drink a day for nonpregnant women and 2 drinks a day for men. One drink equals 12 oz of beer, 5 oz of wine, or 1½ oz of hard liquor. °Lifestyle  °· Work with your doctor to stay at a healthy weight or to lose weight. Ask your doctor what the best weight is for you. °· Get at least 30 minutes of exercise that causes your heart to beat faster (aerobic exercise) most days of the week. This may include walking, swimming, or biking. °· Get at least 30 minutes of exercise that strengthens your muscles (resistance exercise) at least 3 days a week. This may include lifting weights or pilates. °· Do not use any  products that contain nicotine or tobacco. This includes cigarettes and e-cigarettes. If you need help quitting, ask your doctor. °· Check your blood pressure at home as told by your doctor. °· Keep all follow-up visits as told by your doctor. This is important. °Medicines  °· Take over-the-counter and prescription medicines only as told by your doctor. Follow directions carefully. °· Do not skip doses of blood pressure medicine. The medicine does not work as well if you skip doses. Skipping doses also puts you at risk for problems. °· Ask your doctor about side effects or reactions to medicines that you should watch for. °Contact a doctor if: °· You think you are having a reaction to the medicine you are taking. °· You have headaches that keep coming back (recurring). °· You feel dizzy. °· You have swelling in your ankles. °· You have trouble with your vision. °Get help right away if: °· You get a very bad headache. °· You start to feel confused. °· You feel weak or numb. °· You feel faint. °· You get very bad pain in your: °¨ Chest. °¨ Belly (abdomen). °· You throw up (vomit) more than once. °· You have trouble breathing. °Summary °· Hypertension is another name for high blood pressure. °· Making healthy choices can help lower blood pressure. If your blood pressure cannot be controlled with healthy choices, you may need to take medicine. °This information is not intended to replace advice given to you by your   health care provider. Make sure you discuss any questions you have with your health care provider. °Document Released: 03/15/2008 Document Revised: 08/25/2016 Document Reviewed: 08/25/2016 °Elsevier Interactive Patient Education © 2017 Elsevier Inc. ° °

## 2017-02-14 NOTE — Progress Notes (Signed)
Patient is here for establish care  Patient is here for HTN  Patient is only taking 1 of his BP medication amlodipine  Patient has taking it today  Patient has eaten for today   Patient denies pain for today

## 2017-02-14 NOTE — Progress Notes (Signed)
Subjective:  Patient ID: Michael Mooney, male    DOB: 1951-06-04  Age: 66 y.o. MRN: [id number]  CC: Hypertension   HPI Michael Mooney presents for   Hypertension: Patient here for follow-up of elevated blood pressure. He is not exercising and is not adherent to low salt diet.  Blood pressure is not well controlled at home. He has been without his HCTZ medication for over 1 year. He denies finances as a barrier. Cardiac symptoms none. Patient denies chest pain, claudication, dyspnea, lower extremity edema and palpitations.  Cardiovascular risk factors: advanced age (older than 14 for men, 77 for women), hypertension, male gender, sedentary lifestyle and smoking/ tobacco exposure. Use of agents associated with hypertension: none. History of target organ damage: none. He brings with him paperwork for plasma donation. At previous plasma center visit his BP have been elevated in the 170's -190's SBP, low 100-110's DBP.    Outpatient Medications Prior to Visit  Medication Sig Dispense Refill  . amLODipine (NORVASC) 5 MG tablet TAKE 1/2 TABLET BY MOUTH DAILY. 30 tablet 5  . doxycycline (VIBRA-TABS) 100 MG tablet Take 1 tablet (100 mg total) by mouth 2 (two) times daily. (Patient not taking: Reported on 12/18/2014) 14 tablet 0  . hydrochlorothiazide (HYDRODIURIL) 25 MG tablet TAKE 1 TABLET BY MOUTH DAILY. MUST HAVE OFFICE VISIT FOR REFILLS (Patient not taking: Reported on 02/14/2017) 30 tablet 0  . hydrochlorothiazide (HYDRODIURIL) 25 MG tablet TAKE 1 TABLET BY MOUTH DAILY. (Patient not taking: Reported on 02/14/2017) 30 tablet 0   No facility-administered medications prior to visit.     ROS Review of Systems  Constitutional: Negative.   Eyes: Negative.   Respiratory: Negative.   Cardiovascular: Negative.   Gastrointestinal: Negative.   Skin: Negative.   Neurological: Negative.   Psychiatric/Behavioral: Negative.     Objective:  BP (!) 149/71 (BP Location: Left Arm, Patient Position:  Sitting, Cuff Size: Normal)   Pulse 89   Temp 98.2 F (36.8 C) (Oral)   Resp 18   Ht '5\' 8"'  (1.727 m)   Wt 182 lb 12.8 oz (82.9 kg)   SpO2 98%   BMI 27.79 kg/m   BP/Weight 02/14/2017 7/89/3810 10/17/5100  Systolic BP 585 277 824  Diastolic BP 71 75 73  Wt. (Lbs) 182.8 182 187  BMI 27.79 27.68 28.44    Physical Exam  Constitutional: He is oriented to person, place, and time. He appears well-developed and well-nourished.  Eyes: Conjunctivae are normal. Pupils are equal, round, and reactive to light.  Neck: No JVD present.  Cardiovascular: Normal rate, regular rhythm, normal heart sounds and intact distal pulses.   Pulmonary/Chest: Effort normal and breath sounds normal.  Abdominal: Soft. Bowel sounds are normal.  Neurological: He is alert and oriented to person, place, and time.  Skin: Skin is warm and dry.  Psychiatric: He has a normal mood and affect.  Nursing note and vitals reviewed.  Assessment & Plan:   Problem List Items Addressed This Visit      Cardiovascular and Mediastinum   HTN (hypertension) - Primary   Schedule BP recheck in 2 weeks with clinic RN.   If BP is greater than 90/60 (MAP 65 or greater) but not less than 130/80 add losartan  25 mg QD   and recheck in another 2 weeks with clinic RN.    Relevant Medications   amLODipine (NORVASC) 10 MG tablet   hydrochlorothiazide (HYDRODIURIL) 25 MG tablet   Other Relevant Orders   CMP14+EGFR (Completed)  Lipid Panel (Completed)   POCT UA - Microalbumin (Completed)   EKG 12-Lead    Other Visit Diagnoses    Health care maintenance       Relevant Orders   Tdap vaccine greater than or equal to 7yo IM (Completed)   Hepatitis C Antibody (Completed)   Ambulatory referral to Gastroenterology   Abnormal ECG       Relevant Orders   DG Chest 2 View (Completed)      Meds ordered this encounter  Medications  . amLODipine (NORVASC) 10 MG tablet    Sig: TAKE 1/2 TABLET BY MOUTH DAILY.    Dispense:  30 tablet     Refill:  2    Order Specific Question:   Supervising Provider    Answer:   Tresa Garter [id number]  . hydrochlorothiazide (HYDRODIURIL) 25 MG tablet    Sig: Take 1 tablet (25 mg total) by mouth daily.    Dispense:  30 tablet    Refill:  2    Order Specific Question:   Supervising Provider    Answer:   Tresa Garter [id number]    Follow-up: Return in about 2 weeks (around 02/28/2017) for BP check with clinic RN.    Alfonse Spruce FNP

## 2017-02-15 ENCOUNTER — Ambulatory Visit (HOSPITAL_COMMUNITY)
Admission: RE | Admit: 2017-02-15 | Discharge: 2017-02-15 | Disposition: A | Discharge status: Home / Self Care | Payer: Self-pay | Source: Ambulatory Visit | Attending: Family Medicine | Admitting: Family Medicine

## 2017-02-15 DIAGNOSIS — I700 Atherosclerosis of aorta: Secondary | ICD-10-CM | POA: Insufficient documentation

## 2017-02-15 DIAGNOSIS — R9431 Abnormal electrocardiogram [ECG] [EKG]: Secondary | ICD-10-CM | POA: Insufficient documentation

## 2017-02-15 LAB — CMP14+EGFR
ALT: 26 IU/L (ref 0–44)
AST: 27 IU/L (ref 0–40)
Albumin/Globulin Ratio: 1.5 (ref 1.2–2.2)
Albumin: 4.4 g/dL (ref 3.6–4.8)
Alkaline Phosphatase: 62 IU/L (ref 39–117)
BUN/Creatinine Ratio: 10 (ref 10–24)
BUN: 8 mg/dL (ref 8–27)
Bilirubin Total: 0.6 mg/dL (ref 0.0–1.2)
CO2: 23 mmol/L (ref 18–29)
Calcium: 8.8 mg/dL (ref 8.6–10.2)
Chloride: 93 mmol/L — ABNORMAL LOW (ref 96–106)
Creatinine, Ser: 0.81 mg/dL (ref 0.76–1.27)
GFR calc Af Amer: 109 mL/min/{1.73_m2} (ref >59)
GFR calc non Af Amer: 95 mL/min/{1.73_m2} (ref >59)
Globulin, Total: 3 g/dL (ref 1.5–4.5)
Glucose: 86 mg/dL (ref 65–99)
Potassium: 4.2 mmol/L (ref 3.5–5.2)
Sodium: 136 mmol/L (ref 134–144)
Total Protein: 7.4 g/dL (ref 6.0–8.5)

## 2017-02-15 LAB — HEPATITIS C ANTIBODY: Hep C Virus Ab: <0.1 s/co ratio (ref 0.0–0.9)

## 2017-02-15 LAB — LIPID PANEL
Chol/HDL Ratio: 2 ratio (ref 0.0–5.0)
Cholesterol, Total: 128 mg/dL (ref 100–199)
HDL: 63 mg/dL (ref >39)
LDL Calculated: 45 mg/dL (ref 0–99)
Triglycerides: 101 mg/dL (ref 0–149)
VLDL Cholesterol Cal: 20 mg/dL (ref 5–40)

## 2017-02-21 ENCOUNTER — Other Ambulatory Visit: Payer: Self-pay | Admitting: Family Medicine

## 2017-02-21 ENCOUNTER — Telehealth: Payer: Self-pay

## 2017-02-21 DIAGNOSIS — Z9189 Other specified personal risk factors, not elsewhere classified: Secondary | ICD-10-CM | POA: Insufficient documentation

## 2017-02-21 MED ORDER — ASPIRIN EC 81 MG PO TBEC: 81.0000 mg | DELAYED_RELEASE_TABLET | Freq: Every day | ORAL | 3 refills | Status: DC

## 2017-02-21 NOTE — Telephone Encounter (Signed)
-----   Message from Lizbeth BarkMandesia R Hairston, FNP sent at 02/21/2017  9:18 AM EDT ----- Chest x-ray shows normal heart size. No collapse of the lung or  fluid in the lungs present. It also showed narrowing of the aortic blood vessel. You have been prescribed daily aspirin to help lower your risk of heart disease. Recommend follow up in 3 months.

## 2017-02-21 NOTE — Telephone Encounter (Signed)
CMA call patient regarding lab results  Patient did not answer but left a VM stating the reason of the call & to call me back  

## 2017-02-21 NOTE — Telephone Encounter (Signed)
-----   Message from Lizbeth BarkMandesia R Hairston, FNP sent at 02/21/2017  9:06 AM EDT ----- Hepatitis C is negative.  Kidney function normal Liver function normal You have been prescribed a low dose aspirin to help lower your risk of heart disease.

## 2017-02-24 NOTE — Telephone Encounter (Signed)
-----   Message from Mandesia R Hairston, FNP sent at 02/21/2017  9:18 AM EDT ----- Chest x-ray shows normal heart size. No collapse of the lung or  fluid in the lungs present. It also showed narrowing of the aortic blood vessel. You have been prescribed daily aspirin to help lower your risk of heart disease. Recommend follow up in 3 months. 

## 2017-02-24 NOTE — Telephone Encounter (Signed)
CMA call patient regarding lab results  Patient did not answer but a woman answer me once I ask if Michael Mooney was there she said "no" & hang up

## 2017-02-25 ENCOUNTER — Other Ambulatory Visit: Payer: Self-pay | Admitting: Family Medicine

## 2017-02-25 DIAGNOSIS — I10 Essential (primary) hypertension: Secondary | ICD-10-CM

## 2017-03-01 ENCOUNTER — Ambulatory Visit: Payer: Self-pay | Attending: Family Medicine | Admitting: *Deleted

## 2017-03-01 VITALS — BP 140/80

## 2017-03-01 DIAGNOSIS — Z0130 Encounter for examination of blood pressure without abnormal findings: Secondary | ICD-10-CM

## 2017-03-01 DIAGNOSIS — I10 Essential (primary) hypertension: Secondary | ICD-10-CM | POA: Insufficient documentation

## 2017-03-01 MED FILL — AMLODIPINE BESYLATE 10 MG T: 10 | 30 days supply | Qty: 15 | Fill #0

## 2017-03-01 NOTE — Progress Notes (Signed)
Pt arrived to [facility], alert and oriented. Last OV  02/17/17  with M.Hairston, FNP.   Pt denies chest pain, SOB, HA, dizziness, or blurred vision.  Verified medication with patient. He states medication was taken today with an exception of Amlodipine. Pt states he has been out of medication for 1 day. He states at home, his SBP readings are as low as 112- 140.  Manual blood pressure reading in bilateral arms are 140/80.  [facility]. Will pick up today. Appointment for repeat BP was scheduled for May 29 at 11a. Appointment card given. Pt verbalized understanding.

## 2017-03-10 MED FILL — HYDROCHLOROTHIAZIDE 25 MG T: 25 | 30 days supply | Qty: 30 | Fill #1

## 2017-03-28 MED FILL — AMLODIPINE BESYLATE 10 MG T: 10 | 30 days supply | Qty: 15 | Fill #1

## 2017-04-06 ENCOUNTER — Other Ambulatory Visit: Payer: Self-pay | Admitting: Family Medicine

## 2017-04-06 DIAGNOSIS — I10 Essential (primary) hypertension: Secondary | ICD-10-CM

## 2017-04-06 MED FILL — HYDROCHLOROTHIAZIDE 25 MG T: 25 | 30 days supply | Qty: 30 | Fill #2

## 2017-05-11 ENCOUNTER — Other Ambulatory Visit: Payer: Self-pay | Admitting: Family Medicine

## 2017-05-11 DIAGNOSIS — I10 Essential (primary) hypertension: Secondary | ICD-10-CM

## 2017-05-11 MED FILL — HYDROCHLOROTHIAZIDE 25 MG T: 25 | 30 days supply | Qty: 30 | Fill #0

## 2017-05-11 MED FILL — AMLODIPINE BESYLATE 10 MG T: 10 | 30 days supply | Qty: 15 | Fill #2

## 2017-06-13 ENCOUNTER — Other Ambulatory Visit: Payer: Self-pay | Admitting: Family Medicine

## 2017-06-13 DIAGNOSIS — I10 Essential (primary) hypertension: Secondary | ICD-10-CM

## 2017-06-14 MED FILL — AMLODIPINE BESYLATE 10 MG T: 10 | 30 days supply | Qty: 15 | Fill #3

## 2017-06-15 ENCOUNTER — Other Ambulatory Visit: Payer: Self-pay | Admitting: Family Medicine

## 2017-06-15 DIAGNOSIS — I10 Essential (primary) hypertension: Secondary | ICD-10-CM

## 2017-06-15 MED FILL — HYDROCHLOROTHIAZIDE 25 MG T: 25 | 30 days supply | Qty: 30 | Fill #0

## 2017-06-16 NOTE — Telephone Encounter (Signed)
CMA call regarding medication refill sent to our pharmacy   Patient did not answer but left a VM stating the reason of the call & to call back

## 2017-06-23 ENCOUNTER — Ambulatory Visit: Payer: Self-pay | Admitting: Family Medicine

## 2017-06-29 ENCOUNTER — Encounter: Payer: Self-pay | Admitting: Family Medicine

## 2017-06-29 ENCOUNTER — Ambulatory Visit: Payer: Self-pay | Attending: Family Medicine | Admitting: Family Medicine

## 2017-06-29 DIAGNOSIS — I10 Essential (primary) hypertension: Secondary | ICD-10-CM | POA: Insufficient documentation

## 2017-06-29 DIAGNOSIS — Z7982 Long term (current) use of aspirin: Secondary | ICD-10-CM | POA: Insufficient documentation

## 2017-06-29 DIAGNOSIS — Z9189 Other specified personal risk factors, not elsewhere classified: Secondary | ICD-10-CM

## 2017-06-29 MED ORDER — ASPIRIN EC 81 MG PO TBEC: 81.0000 mg | DELAYED_RELEASE_TABLET | Freq: Every day | ORAL | 2 refills | Status: AC

## 2017-06-29 MED ORDER — AMLODIPINE BESYLATE 10 MG PO TABS: ORAL_TABLET | ORAL | 2 refills | Status: DC

## 2017-06-29 MED ORDER — HYDROCHLOROTHIAZIDE 25 MG PO TABS: 25.0000 mg | ORAL_TABLET | Freq: Every day | ORAL | 2 refills | Status: DC

## 2017-06-29 NOTE — Progress Notes (Signed)
Subjective:  Patient ID: Michael Mooney, male    DOB: 11-Mar-1951  Age: 66 y.o. MRN: [id number]  CC: Hypertension   HPI Michael Mooney presents for hypertension follow up. History of HTN. Marland Kitchen He is not exercising and is not adherent to low salt diet.  He does not check BP at home. He reports adhearance with BP medications. Cardiac symptoms none. Patient denies chest pain, claudication, dyspnea, lower extremity edema and palpitations.  Cardiovascular risk factors: advanced age (older than 31 for men, 34 for women), hypertension, male gender and sedentary lifestyle. Use of agents associated with hypertension: none. History of target organ damage: none.     Outpatient Medications Prior to Visit  Medication Sig Dispense Refill  . amLODipine (NORVASC) 10 MG tablet TAKE 1/2 TABLET BY MOUTH DAILY. 30 tablet 2  . hydrochlorothiazide (HYDRODIURIL) 25 MG tablet TAKE 1 TABLET BY MOUTH DAILY. 30 tablet 0  . aspirin EC 81 MG tablet Take 1 tablet (81 mg total) by mouth daily. 90 tablet 3  . doxycycline (VIBRA-TABS) 100 MG tablet Take 1 tablet (100 mg total) by mouth 2 (two) times daily. (Patient not taking: Reported on 12/18/2014) 14 tablet 0   No facility-administered medications prior to visit.     ROS Review of Systems  Constitutional: Negative.   Eyes: Negative.   Respiratory: Negative.   Cardiovascular: Negative.   Gastrointestinal: Negative.   Skin: Negative.   Neurological: Negative for dizziness, syncope and headaches.  Psychiatric/Behavioral: Negative for suicidal ideas.    Objective:  BP 117/64 (BP Location: Left Arm, Patient Position: Sitting, Cuff Size: Normal)   Pulse 82   Temp 99.2 F (37.3 C) (Oral)   Resp 18   Ht  (1.727 m)   Wt 185 lb (83.9 kg)   SpO2 96%   BMI 28.13 kg/m   BP/Weight 06/29/2017 03/01/2017 02/14/2017  Systolic BP 117 140 149  Diastolic BP 64 80 71  Wt. (Lbs) 185 - 182.8  BMI 28.13 - 27.79    Physical Exam  Constitutional: He is oriented to  person, place, and time. He appears well-developed and well-nourished.  Eyes: Pupils are equal, round, and reactive to light. Conjunctivae are normal.  Neck: No JVD present.  Cardiovascular: Normal rate, regular rhythm, normal heart sounds and intact distal pulses.   Pulmonary/Chest: Effort normal and breath sounds normal.  Abdominal: Soft. Bowel sounds are normal. There is no tenderness.  Neurological: He is alert and oriented to person, place, and time.  Skin: Skin is warm and dry.  Psychiatric: He has a normal mood and affect.  Nursing note and vitals reviewed.  Assessment & Plan:   1. Essential hypertension BP well controlled don current medications - hydrochlorothiazide (HYDRODIURIL) 25 MG tablet; Take 1 tablet (25 mg total) by mouth daily.  Dispense: 90 tablet; Refill: 2 - amlodipine (NORVASC) 10 MG tablet; TAKE 1/2 TABLET BY MOUTH DAILY.  Dispense: 90 tablet; Refill: 2  2. At risk for coronary artery disease Mediation refill - aspirin EC 81 MG tablet; Take 1 tablet (81 mg total) by mouth daily.  Dispense: 90 tablet; Refill: 2   Meds ordered this encounter  Medications  . hydrochlorothiazide (HYDRODIURIL) 25 MG tablet    Sig: Take 1 tablet (25 mg total) by mouth daily.    Dispense:  90 tablet    Refill:  2    Must have office visit for refills.    Order Specific Question:   Supervising Provider    Answer:  JEGEDE, OLUGBEMIGA E [id number]  . amLODipine (NORVASC) 10 MG tablet    Sig: TAKE 1/2 TABLET BY MOUTH DAILY.    Dispense:  90 tablet    Refill:  2    Must have office visit for refills.    Order Specific Question:   Supervising Provider    Answer:   Quentin Angst [id number]  . aspirin EC 81 MG tablet    Sig: Take 1 tablet (81 mg total) by mouth daily.    Dispense:  90 tablet    Refill:  2    Order Specific Question:   Supervising Provider    Answer:   Quentin Angst [id number]    Follow-up: Return in about 3 months (around 09/28/2017) for  HTN.   Lizbeth Bark FNP

## 2017-06-29 NOTE — Patient Instructions (Signed)
DASH Eating Plan DASH stands for "Dietary Approaches to Stop Hypertension." The DASH eating plan is a healthy eating plan that has been shown to reduce high blood pressure (hypertension). It may also reduce your risk for type 2 diabetes, heart disease, and stroke. The DASH eating plan may also help with weight loss. What are tips for following this plan? General guidelines  Avoid eating more than 2,300 mg (milligrams) of salt (sodium) a day. If you have hypertension, you may need to reduce your sodium intake to 1,500 mg a day.  Limit alcohol intake to no more than 1 drink a day for nonpregnant women and 2 drinks a day for men. One drink equals 12 oz of beer, 5 oz of wine, or 1 oz of hard liquor.  Work with your health care provider to maintain a healthy body weight or to lose weight. Ask what an ideal weight is for you.  Get at least 30 minutes of exercise that causes your heart to beat faster (aerobic exercise) most days of the week. Activities may include walking, swimming, or biking.  Work with your health care provider or diet and nutrition specialist (dietitian) to adjust your eating plan to your individual calorie needs. Reading food labels  Check food labels for the amount of sodium per serving. Choose foods with less than 5 percent of the Daily Value of sodium. Generally, foods with less than 300 mg of sodium per serving fit into this eating plan.  To find whole grains, look for the word "whole" as the first word in the ingredient list. Shopping  Buy products labeled as "low-sodium" or "no salt added."  Buy fresh foods. Avoid canned foods and premade or frozen meals. Cooking  Avoid adding salt when cooking. Use salt-free seasonings or herbs instead of table salt or sea salt. Check with your health care provider or pharmacist before using salt substitutes.  Do not fry foods. Cook foods using healthy methods such as baking, boiling, grilling, and broiling instead.  Cook with  heart-healthy oils, such as olive, canola, soybean, or sunflower oil. Meal planning   Eat a balanced diet that includes: ? 5 or more servings of fruits and vegetables each day. At each meal, try to fill half of your plate with fruits and vegetables. ? Up to 6-8 servings of whole grains each day. ? Less than 6 oz of lean meat, poultry, or fish each day. A 3-oz serving of meat is about the same size as a deck of cards. One egg equals 1 oz. ? 2 servings of low-fat dairy each day. ? A serving of nuts, seeds, or beans 5 times each week. ? Heart-healthy fats. Healthy fats called Omega-3 fatty acids are found in foods such as flaxseeds and coldwater fish, like sardines, salmon, and mackerel.  Limit how much you eat of the following: ? Canned or prepackaged foods. ? Food that is high in trans fat, such as fried foods. ? Food that is high in saturated fat, such as fatty meat. ? Sweets, desserts, sugary drinks, and other foods with added sugar. ? Full-fat dairy products.  Do not salt foods before eating.  Try to eat at least 2 vegetarian meals each week.  Eat more home-cooked food and less restaurant, buffet, and fast food.  When eating at a restaurant, ask that your food be prepared with less salt or no salt, if possible. What foods are recommended? The items listed may not be a complete list. Talk with your dietitian about what   dietary choices are best for you. Grains Whole-grain or whole-wheat bread. Whole-grain or whole-wheat pasta. Brown rice. Oatmeal. Quinoa. Bulgur. Whole-grain and low-sodium cereals. Pita bread. Low-fat, low-sodium crackers. Whole-wheat flour tortillas. Vegetables Fresh or frozen vegetables (raw, steamed, roasted, or grilled). Low-sodium or reduced-sodium tomato and vegetable juice. Low-sodium or reduced-sodium tomato sauce and tomato paste. Low-sodium or reduced-sodium canned vegetables. Fruits All fresh, dried, or frozen fruit. Canned fruit in natural juice (without  added sugar). Meat and other protein foods Skinless chicken or turkey. Ground chicken or turkey. Pork with fat trimmed off. Fish and seafood. Egg whites. Dried beans, peas, or lentils. Unsalted nuts, nut butters, and seeds. Unsalted canned beans. Lean cuts of beef with fat trimmed off. Low-sodium, lean deli meat. Dairy Low-fat (1%) or fat-free (skim) milk. Fat-free, low-fat, or reduced-fat cheeses. Nonfat, low-sodium ricotta or cottage cheese. Low-fat or nonfat yogurt. Low-fat, low-sodium cheese. Fats and oils Soft margarine without trans fats. Vegetable oil. Low-fat, reduced-fat, or light mayonnaise and salad dressings (reduced-sodium). Canola, safflower, olive, soybean, and sunflower oils. Avocado. Seasoning and other foods Herbs. Spices. Seasoning mixes without salt. Unsalted popcorn and pretzels. Fat-free sweets. What foods are not recommended? The items listed may not be a complete list. Talk with your dietitian about what dietary choices are best for you. Grains Baked goods made with fat, such as croissants, muffins, or some breads. Dry pasta or rice meal packs. Vegetables Creamed or fried vegetables. Vegetables in a cheese sauce. Regular canned vegetables (not low-sodium or reduced-sodium). Regular canned tomato sauce and paste (not low-sodium or reduced-sodium). Regular tomato and vegetable juice (not low-sodium or reduced-sodium). Pickles. Olives. Fruits Canned fruit in a light or heavy syrup. Fried fruit. Fruit in cream or butter sauce. Meat and other protein foods Fatty cuts of meat. Ribs. Fried meat. Bacon. Sausage. Bologna and other processed lunch meats. Salami. Fatback. Hotdogs. Bratwurst. Salted nuts and seeds. Canned beans with added salt. Canned or smoked fish. Whole eggs or egg yolks. Chicken or turkey with skin. Dairy Whole or 2% milk, cream, and half-and-half. Whole or full-fat cream cheese. Whole-fat or sweetened yogurt. Full-fat cheese. Nondairy creamers. Whipped toppings.  Processed cheese and cheese spreads. Fats and oils Butter. Stick margarine. Lard. Shortening. Ghee. Bacon fat. Tropical oils, such as coconut, palm kernel, or palm oil. Seasoning and other foods Salted popcorn and pretzels. Onion salt, garlic salt, seasoned salt, table salt, and sea salt. Worcestershire sauce. Tartar sauce. Barbecue sauce. Teriyaki sauce. Soy sauce, including reduced-sodium. Steak sauce. Canned and packaged gravies. Fish sauce. Oyster sauce. Cocktail sauce. Horseradish that you find on the shelf. Ketchup. Mustard. Meat flavorings and tenderizers. Bouillon cubes. Hot sauce and Tabasco sauce. Premade or packaged marinades. Premade or packaged taco seasonings. Relishes. Regular salad dressings. Where to find more information:  National Heart, Lung, and Blood Institute: www.nhlbi.nih.gov  American Heart Association: www.heart.org Summary  The DASH eating plan is a healthy eating plan that has been shown to reduce high blood pressure (hypertension). It may also reduce your risk for type 2 diabetes, heart disease, and stroke.  With the DASH eating plan, you should limit salt (sodium) intake to 2,300 mg a day. If you have hypertension, you may need to reduce your sodium intake to 1,500 mg a day.  When on the DASH eating plan, aim to eat more fresh fruits and vegetables, whole grains, lean proteins, low-fat dairy, and heart-healthy fats.  Work with your health care provider or diet and nutrition specialist (dietitian) to adjust your eating plan to your individual   calorie needs. This information is not intended to replace advice given to you by your health care provider. Make sure you discuss any questions you have with your health care provider. Document Released: 09/16/2011 Document Revised: 09/20/2016 Document Reviewed: 09/20/2016 Elsevier Interactive Patient Education  2017 Elsevier Inc.  

## 2017-06-29 NOTE — Progress Notes (Signed)
Patient is here for HTN  

## 2017-07-14 MED FILL — HYDROCHLOROTHIAZIDE 25 MG T: 25 | 30 days supply | Qty: 30 | Fill #0

## 2017-07-14 MED FILL — AMLODIPINE BESYLATE 10 MG T: 10 | 30 days supply | Qty: 15 | Fill #4

## 2017-08-11 ENCOUNTER — Other Ambulatory Visit: Payer: Self-pay | Admitting: Family Medicine

## 2017-08-11 DIAGNOSIS — I10 Essential (primary) hypertension: Secondary | ICD-10-CM

## 2017-08-11 MED FILL — HYDROCHLOROTHIAZIDE 25 MG T: 25 | 30 days supply | Qty: 30 | Fill #1

## 2017-08-11 MED FILL — AMLODIPINE BESYLATE 10 MG T: 10 | 30 days supply | Qty: 15 | Fill #5

## 2017-09-16 ENCOUNTER — Other Ambulatory Visit: Payer: Self-pay | Admitting: Family Medicine

## 2017-09-16 DIAGNOSIS — I10 Essential (primary) hypertension: Secondary | ICD-10-CM

## 2017-09-16 MED FILL — HYDROCHLOROTHIAZIDE 25 MG T: 25 | 30 days supply | Qty: 30 | Fill #2

## 2017-10-13 MED FILL — HYDROCHLOROTHIAZIDE 25 MG T: 25 | 30 days supply | Qty: 30 | Fill #3

## 2017-10-13 MED FILL — AMLODIPINE BESYLATE 10 MG T: 10 | 30 days supply | Qty: 15 | Fill #0

## 2017-10-26 ENCOUNTER — Ambulatory Visit: Payer: Self-pay | Attending: Family Medicine

## 2017-10-28 ENCOUNTER — Ambulatory Visit: Payer: Self-pay | Attending: Family Medicine | Admitting: Family Medicine

## 2017-10-28 ENCOUNTER — Encounter: Payer: Self-pay | Admitting: Family Medicine

## 2017-10-28 VITALS — BP 132/77 | HR 76 | Temp 97.9°F | Resp 16 | Wt 190.0 lb

## 2017-10-28 DIAGNOSIS — Z7982 Long term (current) use of aspirin: Secondary | ICD-10-CM | POA: Insufficient documentation

## 2017-10-28 DIAGNOSIS — M79674 Pain in right toe(s): Secondary | ICD-10-CM | POA: Insufficient documentation

## 2017-10-28 DIAGNOSIS — I10 Essential (primary) hypertension: Secondary | ICD-10-CM | POA: Insufficient documentation

## 2017-10-28 DIAGNOSIS — Z79899 Other long term (current) drug therapy: Secondary | ICD-10-CM | POA: Insufficient documentation

## 2017-10-28 MED ORDER — PREDNISONE 10 MG PO TABS
ORAL_TABLET | ORAL | 0 refills | Status: DC
Start: 2017-10-28 — End: 2017-11-05

## 2017-10-28 MED ORDER — INDOMETHACIN 25 MG PO CAPS: 25.0000 mg | ORAL_CAPSULE | Freq: Two times a day (BID) | ORAL | 0 refills | Status: DC

## 2017-10-28 MED FILL — predniSONE 10 MG TABS: 10 | 6 days supply | Qty: 21 | Fill #0

## 2017-10-28 MED FILL — INDOMETHACIN 25 MG CAPSULE: 25 | 15 days supply | Qty: 30 | Fill #0

## 2017-10-28 NOTE — Patient Instructions (Addendum)
Musculoskeletal Pain  Musculoskeletal pain is muscle and bone aches and pains. This pain can occur in any part of the body.  Follow these instructions at home:  · Only take medicines for pain, discomfort, or fever as told by your health care provider.  · You may continue all activities unless the activities cause more pain. When the pain lessens, slowly resume normal activities. Gradually increase the intensity and duration of the activities or exercise.  · During periods of severe pain, bed rest may be helpful. Lie or sit in any position that is comfortable, but get out of bed and walk around at least every several hours.  · If directed, put ice on the injured area.  ? Put ice in a plastic bag.  ? Place a towel between your skin and the bag.  ? Leave the ice on for 20 minutes, 2-3 times a day.  Contact a health care provider if:  · Your pain is getting worse.  · Your pain is not relieved with medicines.  · You lose function in the area of the pain if the pain is in your arms, legs, or neck.  This information is not intended to replace advice given to you by your health care provider. Make sure you discuss any questions you have with your health care provider.  Document Released: 09/27/2005 Document Revised: 03/09/2016 Document Reviewed: 06/01/2013  Elsevier Interactive Patient Education © 2017 Elsevier Inc.

## 2017-10-28 NOTE — Progress Notes (Signed)
   Subjective:  Patient ID: Michael Mooney, male    DOB: 06/04/1951  Age: 67 y.o. MRN: [id number]  CC: Toe Pain (right big toe) and Hypertension   HPI Michael LentCharles J Mostek presents for complaints of right great toe pain. Onset was Monday. He reports pain and tenderness. He denies any history of injury. He denies any history of gout. He does reports diet high in meats, cheeses, and beer. Hypertension follow up. History of HTN. Marland Kitchen. He is not exercising and is adherent to low salt diet.  He does not check BP at home. He reports adhearance with BP medications. Cardiac symptoms none. Patient denies chest pain, claudication, dyspnea, lower extremity edema and palpitations.  Cardiovascular risk factors: advanced age (older than 7855 for men, 7965 for women), hypertension, male gender and sedentary lifestyle. Use of agents associated with hypertension: none. History of target organ damage: none.     Outpatient Medications Prior to Visit  Medication Sig Dispense Refill  . amLODipine (NORVASC) 10 MG tablet TAKE 1/2 TABLET BY MOUTH DAILY. 90 tablet 2  . aspirin EC 81 MG tablet Take 1 tablet (81 mg total) by mouth daily. 90 tablet 2  . hydrochlorothiazide (HYDRODIURIL) 25 MG tablet Take 1 tablet (25 mg total) by mouth daily. 90 tablet 2   No facility-administered medications prior to visit.     Review of Systems  Constitutional: Negative.   Respiratory: Negative.   Cardiovascular: Negative.   Musculoskeletal: Positive for joint pain and myalgias.  Psychiatric/Behavioral: Negative.      Objective:  BP 132/77   Pulse 76   Temp 97.9 F (36.6 C) (Oral)   Resp 16   Wt 190 lb (86.2 kg)   SpO2 97%   BMI 28.89 kg/m   BP/Weight 10/28/2017 06/29/2017 03/01/2017  Systolic BP 132 117 140  Diastolic BP 77 64 80  Wt. (Lbs) 190 185 -  BMI 28.89 28.13 -   Physical Exam  Nursing note and vitals reviewed. Constitutional: He appears well-developed and well-nourished.  Neck: No JVD present.  Cardiovascular:  Normal rate, regular rhythm, normal heart sounds and intact distal pulses.  Respiratory: Effort normal and breath sounds normal.  GI: Soft. Bowel sounds are normal.  Musculoskeletal:       Right foot: There is bony tenderness (MTP joints) and swelling.  Skin: Skin is warm and dry.     Assessment & Plan:    1. Essential hypertension  - Basic Metabolic Panel  2. Great toe pain, right  - Uric Acid - indomethacin (INDOCIN) 25 MG capsule; Take 1 capsule (25 mg total) by mouth 2 (two) times daily with a meal.  Dispense: 30 capsule; Refill: 0 - predniSONE (DELTASONE) 10 MG tablet; DAY 1: TAKE 6 TABLETS BY MOUTH WITH MEAL; DAY 2: TAKE 5 TABLETS; DAY 3: TAKE 4 TABLETS; DAY 4: TAKE 3 TABLETS; DAY 5: TAKE 2 TABLETS; DAY 6: TAKE 1 TABLET.  Dispense: 21 tablet; Refill: 0   Follow-up: Return if symptoms worsen or fail to improve.   Lizbeth BarkMandesia R Hairston FNP

## 2017-10-29 LAB — URIC ACID: Uric Acid: 8.5 mg/dL (ref 3.7–8.6)

## 2017-10-29 LAB — BASIC METABOLIC PANEL
BUN/Creatinine Ratio: 10 (ref 10–24)
BUN: 10 mg/dL (ref 8–27)
CALCIUM: 9.2 mg/dL (ref 8.6–10.2)
CO2: 27 mmol/L (ref 20–29)
CREATININE: 1.04 mg/dL (ref 0.76–1.27)
Chloride: 96 mmol/L (ref 96–106)
GFR calc Af Amer: 87 mL/min/{1.73_m2} (ref >59)
GFR calc non Af Amer: 76 mL/min/{1.73_m2} (ref >59)
GLUCOSE: 108 mg/dL — AB (ref 65–99)
POTASSIUM: 3.3 mmol/L — AB (ref 3.5–5.2)
SODIUM: 139 mmol/L (ref 134–144)

## 2017-11-02 ENCOUNTER — Other Ambulatory Visit: Payer: Self-pay | Admitting: Family Medicine

## 2017-11-02 DIAGNOSIS — E876 Hypokalemia: Secondary | ICD-10-CM

## 2017-11-02 MED ORDER — POTASSIUM CHLORIDE CRYS ER 20 MEQ PO TBCR: 20.0000 meq | EXTENDED_RELEASE_TABLET | Freq: Every day | ORAL | 0 refills | Status: DC

## 2017-11-05 ENCOUNTER — Other Ambulatory Visit: Payer: Self-pay | Admitting: Family Medicine

## 2017-11-05 DIAGNOSIS — E876 Hypokalemia: Secondary | ICD-10-CM

## 2017-11-05 DIAGNOSIS — M79674 Pain in right toe(s): Secondary | ICD-10-CM

## 2017-11-07 MED ORDER — POTASSIUM CHLORIDE CRYS ER 20 MEQ PO TBCR: 20.0000 meq | EXTENDED_RELEASE_TABLET | Freq: Every day | ORAL | 0 refills | Status: DC

## 2017-11-07 MED ORDER — PREDNISONE 10 MG PO TABS: ORAL_TABLET | ORAL | 0 refills | Status: DC

## 2017-11-09 ENCOUNTER — Telehealth (INDEPENDENT_AMBULATORY_CARE_PROVIDER_SITE_OTHER): Payer: Self-pay | Admitting: *Deleted

## 2017-11-09 NOTE — Telephone Encounter (Signed)
Medical Assistant left message on patient's home and cell voicemail. Voicemail states to [place] [facility] [place] [facility] [place] [phone]. !!!Please inform patient of kidney function being normal. Potassium level is low and patient needs to pick up supplements. Patient needs a recheck completed in 3 weeks, please schedule. Uric acid is normal!!!

## 2017-11-09 NOTE — Telephone Encounter (Signed)
-----   Message from Lizbeth BarkMandesia R Hairston, FNP sent at 11/02/2017 12:33 PM EST ----- Kidney function normal. Potassium level is low. You will be prescribed potassium supplements.Good sources of potassium are bananas, sweet potatoes, oranges, spinach, and broccoli. When potassium is decreased or increased it can affect the heart. Recommend walk in lab visit in 3 weeks to check levels. Uric acid test is normal. When uric acid is high it can cause gout.

## 2017-11-11 MED FILL — POTASSIUM CL ER 20 MEQ TAB: 20 | 21 days supply | Qty: 21 | Fill #0

## 2017-11-16 MED FILL — HYDROCHLOROTHIAZIDE 25 MG T: 25 | 30 days supply | Qty: 30 | Fill #4

## 2017-11-17 ENCOUNTER — Other Ambulatory Visit: Payer: Self-pay | Admitting: Family Medicine

## 2017-11-17 DIAGNOSIS — M79674 Pain in right toe(s): Secondary | ICD-10-CM

## 2017-11-17 MED FILL — INDOMETHACIN 25 MG CAPSULE: 25 | 15 days supply | Qty: 30 | Fill #0

## 2017-12-05 ENCOUNTER — Other Ambulatory Visit: Payer: Self-pay | Admitting: Family Medicine

## 2017-12-05 DIAGNOSIS — E876 Hypokalemia: Secondary | ICD-10-CM

## 2017-12-05 MED FILL — AMLODIPINE BESYLATE 10 MG T: 10 | 30 days supply | Qty: 15 | Fill #1

## 2017-12-12 ENCOUNTER — Ambulatory Visit: Payer: Self-pay | Admitting: Internal Medicine

## 2017-12-12 MED FILL — HYDROCHLOROTHIAZIDE 25 MG T: 25 | 30 days supply | Qty: 30 | Fill #5

## 2017-12-25 ENCOUNTER — Other Ambulatory Visit: Payer: Self-pay | Admitting: Internal Medicine

## 2017-12-25 DIAGNOSIS — M79674 Pain in right toe(s): Secondary | ICD-10-CM

## 2017-12-25 DIAGNOSIS — E876 Hypokalemia: Secondary | ICD-10-CM

## 2018-01-16 MED FILL — HYDROCHLOROTHIAZIDE 25 MG T: 25 | 30 days supply | Qty: 30 | Fill #6

## 2018-01-16 MED FILL — AMLODIPINE BESYLATE 10 MG T: 10 | 30 days supply | Qty: 15 | Fill #2

## 2018-01-30 ENCOUNTER — Ambulatory Visit: Payer: Self-pay | Attending: Internal Medicine | Admitting: Internal Medicine

## 2018-01-30 ENCOUNTER — Encounter: Payer: Self-pay | Admitting: Internal Medicine

## 2018-01-30 VITALS — BP 167/91 | HR 92 | Temp 98.3°F | Resp 16 | Ht 68.0 in | Wt 192.4 lb

## 2018-01-30 DIAGNOSIS — Z79899 Other long term (current) drug therapy: Secondary | ICD-10-CM | POA: Insufficient documentation

## 2018-01-30 DIAGNOSIS — Z131 Encounter for screening for diabetes mellitus: Secondary | ICD-10-CM

## 2018-01-30 DIAGNOSIS — Z1211 Encounter for screening for malignant neoplasm of colon: Secondary | ICD-10-CM

## 2018-01-30 DIAGNOSIS — I10 Essential (primary) hypertension: Secondary | ICD-10-CM | POA: Insufficient documentation

## 2018-01-30 DIAGNOSIS — Z125 Encounter for screening for malignant neoplasm of prostate: Secondary | ICD-10-CM

## 2018-01-30 MED ORDER — LISINOPRIL 10 MG PO TABS: 10.0000 mg | ORAL_TABLET | Freq: Every day | ORAL | 3 refills | Status: DC

## 2018-01-30 MED FILL — LISINOPRIL 10 MG TABS: 10 | 30 days supply | Qty: 30 | Fill #0

## 2018-01-30 NOTE — Progress Notes (Signed)
Patient ID: Michael LentCharles J Mooney, male    DOB: 12/19/1950  MRN: [id number]  CC: re-establish and Hypertension   Subjective: Michael RangerCharles Storti is a 67 y.o. male who presents for chronic ds management and to est care with me as PCP. His concerns today include:  Pt with hx of HTN  HTN:  Compliant with HCTZ and Amlodipine.  Out of Potassium x 2 mths.  He limits salt in foods -checks BP almost every morning with upper arm cuff.  Range 140s/80s -no CP/SOB on exertion. No LE edema -nonsmoker  Reports intermittent swelling, redness and pain  of RT big toe.  Told he may have gout. Uric acid level was 8.5 in January.  Last episode was about 1 mth about.  He soaked foot in warm water and [place] salt.  Lasted about 2 days  HM: due for colonoscopy  Patient Active Problem List   Diagnosis Date Noted  . At risk for coronary artery disease 02/21/2017  . HTN (hypertension) 12/18/2014     Current Outpatient Medications on File Prior to Visit  Medication Sig Dispense Refill  . amLODipine (NORVASC) 10 MG tablet TAKE 1/2 TABLET BY MOUTH DAILY. 90 tablet 2  . aspirin EC 81 MG tablet Take 1 tablet (81 mg total) by mouth daily. 90 tablet 2  . indomethacin (INDOCIN) 25 MG capsule TAKE 1 CAPSULE (25 MG TOTAL) BY MOUTH 2 (TWO) TIMES DAILY WITH A MEAL. 30 capsule 0   No current facility-administered medications on file prior to visit.     No Known Allergies  Social History   Socioeconomic History  . Marital status: Single    Spouse name: Not on file  . Number of children: Not on file  . Years of education: Not on file  . Highest education level: Not on file  Occupational History  . Not on file  Social Needs  . Financial resource strain: Not on file  . Food insecurity:    Worry: Not on file    Inability: Not on file  . Transportation needs:    Medical: Not on file    Non-medical: Not on file  Tobacco Use  . Smoking status: Never Smoker  . Smokeless tobacco: Never Used  Substance and  Sexual Activity  . Alcohol use: Yes    Alcohol/week: 3.0 oz    Types: 6 Standard drinks or equivalent per week  . Drug use: No  . Sexual activity: Not on file  Lifestyle  . Physical activity:    Days per week: Not on file    Minutes per session: Not on file  . Stress: Not on file  Relationships  . Social connections:    Talks on phone: Not on file    Gets together: Not on file    Attends religious service: Not on file    Active member of club or organization: Not on file    Attends meetings of clubs or organizations: Not on file    Relationship status: Not on file  . Intimate partner violence:    Fear of current or ex partner: Not on file    Emotionally abused: Not on file    Physically abused: Not on file    Forced sexual activity: Not on file  Other Topics Concern  . Not on file  Social History Narrative  . Not on file    Family History  Problem Relation Age of Onset  . Hypertension Mother     No past surgical history on  file.  ROS: Review of Systems  Constitutional: Negative for activity change.  Gastrointestinal: Negative for abdominal distention and blood in stool.  Genitourinary: Positive for frequency (gets up twice at nights to urinate.no incontinence of urine). Negative for dysuria, enuresis and hematuria.       Urine flows freely    PHYSICAL EXAM: BP (!) 167/91   Pulse 92   Temp 98.3 F (36.8 C) (Oral)   Resp 16   Ht 5\' 8"  (1.727 m)   Wt 192 lb 6.4 oz (87.3 kg)   SpO2 96%   BMI 29.25 kg/m   Wt Readings from Last 3 Encounters:  01/30/18 192 lb 6.4 oz (87.3 kg)  10/28/17 190 lb (86.2 kg)  06/29/17 185 lb (83.9 kg)   Repeat BP 150/70 Physical Exam  General appearance - alert, well appearing, older African-American male in no distress.  He is noted to stammer when talking Mental status - alert, oriented to person, place, and time, normal mood, behavior, speech, dress, motor activity, and thought processes Mouth - mucous membranes moist, pharynx  normal without lesions.  Several molars decayed and broken off in the gum. Neck - supple, no significant adenopathy Chest - clear to auscultation, no wheezes, rales or rhonchi, symmetric air entry Heart - normal rate, regular rhythm, normal S1, S2, no murmurs, rubs, clicks or gallops Extremities - peripheral pulses normal, no pedal edema, no clubbing or cyanosis  Depression screen [facility] 2/9 01/30/2018 10/28/2017 06/29/2017  Decreased Interest 0 0 0  Down, Depressed, Hopeless 0 0 0  PHQ - 2 Score 0 0 0  Altered sleeping - - 0  Tired, decreased energy - - 0  Change in appetite - - 0  Feeling bad or failure about yourself  - - 0  Trouble concentrating - - 1  Moving slowly or fidgety/restless - - 0  Suicidal thoughts - - 0  PHQ-9 Score - - 1    ASSESSMENT AND PLAN: 1. Essential hypertension Not at goal.  Given history of possible gout, I recommend stopping HCTZ.  We will start lisinopril instead.  Continue amlodipine. - lisinopril (PRINIVIL,ZESTRIL) 10 MG tablet; Take 1 tablet (10 mg total) by mouth daily.  Dispense: 90 tablet; Refill: 3 - Lipid panel - CBC - Comprehensive metabolic panel  2. Prostate cancer screening Discussed prostate cancer screening.  Patient is agreeable to having PSA checked. - PSA  3. Colon cancer screening - Ambulatory referral to Gastroenterology  4. Diabetes mellitus screening - Hemoglobin A1c   Patient was given the opportunity to ask questions.  Patient verbalized understanding of the plan and was able to repeat key elements of the plan.   Orders Placed This Encounter  Procedures  . Lipid panel  . CBC  . Comprehensive metabolic panel  . PSA  . Hemoglobin A1c  . Ambulatory referral to Gastroenterology     Requested Prescriptions   Signed Prescriptions Disp Refills  . lisinopril (PRINIVIL,ZESTRIL) 10 MG tablet 90 tablet 3    Sig: Take 1 tablet (10 mg total) by mouth daily.    Return in about 3 months (around 05/01/2018).  Jonah Blue,  MD, FACP

## 2018-01-30 NOTE — Patient Instructions (Signed)
Stop Hydrochlorothiazide. Start Lisinopril 10 mg daily instead. Continue Amlodipine. Continue to check your blood pressure 2-3 days a week.  The goal is 130/80 or lower.

## 2018-01-31 LAB — COMPREHENSIVE METABOLIC PANEL
A/G RATIO: 1.5 (ref 1.2–2.2)
ALT: 30 IU/L (ref 0–44)
AST: 26 IU/L (ref 0–40)
Albumin: 4.1 g/dL (ref 3.6–4.8)
Alkaline Phosphatase: 63 IU/L (ref 39–117)
BUN/Creatinine Ratio: 15 (ref 10–24)
BUN: 14 mg/dL (ref 8–27)
Bilirubin Total: 0.3 mg/dL (ref 0.0–1.2)
CALCIUM: 9.4 mg/dL (ref 8.6–10.2)
CO2: 25 mmol/L (ref 20–29)
Chloride: 94 mmol/L — ABNORMAL LOW (ref 96–106)
Creatinine, Ser: 0.91 mg/dL (ref 0.76–1.27)
GFR calc Af Amer: 103 mL/min/{1.73_m2} (ref >59)
GFR, EST NON AFRICAN AMERICAN: 89 mL/min/{1.73_m2} (ref >59)
Globulin, Total: 2.8 g/dL (ref 1.5–4.5)
Glucose: 94 mg/dL (ref 65–99)
POTASSIUM: 3.8 mmol/L (ref 3.5–5.2)
Sodium: 137 mmol/L (ref 134–144)
Total Protein: 6.9 g/dL (ref 6.0–8.5)

## 2018-01-31 LAB — LIPID PANEL
CHOL/HDL RATIO: 2.9 ratio (ref 0.0–5.0)
Cholesterol, Total: 169 mg/dL (ref 100–199)
HDL: 59 mg/dL (ref >39)
LDL Calculated: 77 mg/dL (ref 0–99)
Triglycerides: 167 mg/dL — ABNORMAL HIGH (ref 0–149)
VLDL CHOLESTEROL CAL: 33 mg/dL (ref 5–40)

## 2018-01-31 LAB — CBC
Hematocrit: 39.6 % (ref 37.5–51.0)
Hemoglobin: 13.3 g/dL (ref 13.0–17.7)
MCH: 31.7 pg (ref 26.6–33.0)
MCHC: 33.6 g/dL (ref 31.5–35.7)
MCV: 94 fL (ref 79–97)
PLATELETS: 258 10*3/uL (ref 150–379)
RBC: 4.2 x10E6/uL (ref 4.14–5.80)
RDW: 12.8 % (ref 12.3–15.4)
WBC: 5.6 10*3/uL (ref 3.4–10.8)

## 2018-01-31 LAB — PSA: Prostate Specific Ag, Serum: 1.3 ng/mL (ref 0.0–4.0)

## 2018-01-31 LAB — HEMOGLOBIN A1C
ESTIMATED AVERAGE GLUCOSE: 100 mg/dL
Hgb A1c MFr Bld: 5.1 % (ref 4.8–5.6)

## 2018-02-14 MED FILL — HYDROCHLOROTHIAZIDE 25 MG T: 25 | 30 days supply | Qty: 30 | Fill #7

## 2018-03-02 MED FILL — LISINOPRIL 10 MG TABS: 10 | 30 days supply | Qty: 30 | Fill #1

## 2018-04-17 MED FILL — LISINOPRIL 10 MG TABS: 10 | 30 days supply | Qty: 30 | Fill #2

## 2018-04-25 ENCOUNTER — Other Ambulatory Visit: Payer: Self-pay

## 2018-04-27 MED FILL — AMLODIPINE BESYLATE 10 MG T: 10 | 30 days supply | Qty: 15 | Fill #3

## 2018-05-23 MED FILL — LISINOPRIL 10 MG TABS: 10 | 30 days supply | Qty: 30 | Fill #3

## 2018-06-05 MED FILL — AMLODIPINE BESYLATE 10 MG T: 10 | 30 days supply | Qty: 15 | Fill #4

## 2018-06-21 MED FILL — LISINOPRIL 10 MG TABS: 10 | 30 days supply | Qty: 30 | Fill #4

## 2018-07-21 MED FILL — LISINOPRIL 10 MG TABS: 10 | 30 days supply | Qty: 30 | Fill #5

## 2018-07-24 ENCOUNTER — Other Ambulatory Visit: Payer: Self-pay

## 2018-07-24 DIAGNOSIS — I10 Essential (primary) hypertension: Secondary | ICD-10-CM

## 2018-07-24 MED ORDER — AMLODIPINE BESYLATE 10 MG PO TABS
ORAL_TABLET | ORAL | 0 refills | Status: DC
Start: 2018-07-24 — End: 2018-08-17

## 2018-07-24 MED FILL — AMLODIPINE BESYLATE 10 MG T: 10 | 30 days supply | Qty: 15 | Fill #0

## 2018-08-17 ENCOUNTER — Encounter: Payer: Self-pay | Admitting: Internal Medicine

## 2018-08-17 ENCOUNTER — Ambulatory Visit: Payer: Medicare HMO | Attending: Internal Medicine | Admitting: Internal Medicine

## 2018-08-17 VITALS — BP 151/86 | HR 84 | Temp 99.2°F | Resp 16 | Wt 180.0 lb

## 2018-08-17 DIAGNOSIS — M25511 Pain in right shoulder: Secondary | ICD-10-CM

## 2018-08-17 DIAGNOSIS — Z2821 Immunization not carried out because of patient refusal: Secondary | ICD-10-CM

## 2018-08-17 DIAGNOSIS — I10 Essential (primary) hypertension: Secondary | ICD-10-CM | POA: Diagnosis not present

## 2018-08-17 DIAGNOSIS — Z8249 Family history of ischemic heart disease and other diseases of the circulatory system: Secondary | ICD-10-CM | POA: Insufficient documentation

## 2018-08-17 DIAGNOSIS — Z7982 Long term (current) use of aspirin: Secondary | ICD-10-CM | POA: Insufficient documentation

## 2018-08-17 DIAGNOSIS — Z1211 Encounter for screening for malignant neoplasm of colon: Secondary | ICD-10-CM | POA: Diagnosis not present

## 2018-08-17 DIAGNOSIS — R531 Weakness: Secondary | ICD-10-CM | POA: Insufficient documentation

## 2018-08-17 DIAGNOSIS — Z79899 Other long term (current) drug therapy: Secondary | ICD-10-CM | POA: Insufficient documentation

## 2018-08-17 MED ORDER — NAPROXEN 500 MG PO TABS: 500.0000 mg | ORAL_TABLET | Freq: Two times a day (BID) | ORAL | 0 refills | Status: DC

## 2018-08-17 MED ORDER — AMLODIPINE BESYLATE 10 MG PO TABS: 10.0000 mg | ORAL_TABLET | Freq: Every day | ORAL | 3 refills | Status: DC

## 2018-08-17 MED FILL — AMLODIPINE BESYLATE 10 MG T: 10 | 90 days supply | Qty: 90 | Fill #0

## 2018-08-17 MED FILL — NAPROXEN 500 MG TABLET: 500 | 10 days supply | Qty: 20 | Fill #0

## 2018-08-17 NOTE — Patient Instructions (Signed)
Your blood pressure is not at goal.  I recommend increasing amlodipine to 10 mg 1 tablet daily.  Continue to limit salt in the foods.  Continue regular exercise.  I think you have bursitis in the right shoulder.  Use the Naprosyn as needed for several days.  If no improvement, please let me know so that we can refer you to orthopedics for steroid injection.  I have referred you for colonoscopy for colon cancer screening.

## 2018-08-17 NOTE — Progress Notes (Signed)
Patient ID: ZAI CHMIEL, male    DOB: Mar 14, 1951  MRN: [id number]  CC: Shoulder Pain   Subjective: Michael Mooney is a 67 y.o. male who presents for shoulder pain His concerns today include:  Pt with hx of HTN  Pt c/o pain in RT shoulder x 1 wk -not sure of any initiating factors.   -feels the arm is a little weak.  No numbness or tingling.  No radiation. -worse with attempted elevation more than 90 degrees.  Pain is not constant; occurs only with certain movement.   HTN: compliant with Norvasc and salt restriction.  He walks 2-3 times a week for exercise.  He checks blood pressure at home almost daily.  Reports systolic blood pressure in the 140s and diastolic in the 80s. No CP/SOB/LE edema/HA/dizziness.  HM:  Declines flu shot and Prevnar 13.  Referred for colonoscopy on last visit but they were unable to reach him as both telephone numbers in the chart were incorrect.  Patient Active Problem List   Diagnosis Date Noted  . At risk for coronary artery disease 02/21/2017  . HTN (hypertension) 12/18/2014     Current Outpatient Medications on File Prior to Visit  Medication Sig Dispense Refill  . amLODipine (NORVASC) 10 MG tablet TAKE 1/2 TABLET BY MOUTH DAILY. MUST MAKE APPT FOR FURTHER REFILLS 15 tablet 0  . aspirin EC 81 MG tablet Take 1 tablet (81 mg total) by mouth daily. 90 tablet 2  . indomethacin (INDOCIN) 25 MG capsule TAKE 1 CAPSULE (25 MG TOTAL) BY MOUTH 2 (TWO) TIMES DAILY WITH A MEAL. 30 capsule 0  . lisinopril (PRINIVIL,ZESTRIL) 10 MG tablet Take 1 tablet (10 mg total) by mouth daily. 90 tablet 3   No current facility-administered medications on file prior to visit.     No Known Allergies  Social History   Socioeconomic History  . Marital status: Single    Spouse name: Not on file  . Number of children: Not on file  . Years of education: Not on file  . Highest education level: Not on file  Occupational History  . Not on file  Social Needs  .  Financial resource strain: Not on file  . Food insecurity:    Worry: Not on file    Inability: Not on file  . Transportation needs:    Medical: Not on file    Non-medical: Not on file  Tobacco Use  . Smoking status: Never Smoker  . Smokeless tobacco: Never Used  Substance and Sexual Activity  . Alcohol use: Yes    Alcohol/week: 6.0 standard drinks    Types: 6 Standard drinks or equivalent per week  . Drug use: No  . Sexual activity: Not on file  Lifestyle  . Physical activity:    Days per week: Not on file    Minutes per session: Not on file  . Stress: Not on file  Relationships  . Social connections:    Talks on phone: Not on file    Gets together: Not on file    Attends religious service: Not on file    Active member of club or organization: Not on file    Attends meetings of clubs or organizations: Not on file    Relationship status: Not on file  . Intimate partner violence:    Fear of current or ex partner: Not on file    Emotionally abused: Not on file    Physically abused: Not on file  Forced sexual activity: Not on file  Other Topics Concern  . Not on file  Social History Narrative  . Not on file    Family History  Problem Relation Age of Onset  . Hypertension Mother     No past surgical history on file.  ROS: Review of Systems Negative except as above.    PHYSICAL EXAM: BP (!) 151/86   Pulse 84   Temp 99.2 F (37.3 C) (Oral)   Resp 16   Wt 180 lb (81.6 kg)   SpO2 97%   BMI 27.37 kg/m   Repeat blood pressure 154/84 Physical Exam  General appearance - alert, well appearing, and in no distress Mental status - normal mood, behavior, speech, dress, motor activity, and thought processes Neck - supple, no significant adenopathy Chest - clear to auscultation, no wheezes, rales or rhonchi, symmetric air entry Heart - normal rate, regular rhythm, normal S1, S2, 1/6 SEM at apex. Musculoskeletal -right shoulder: No point tenderness.  Good range of  motion in all directions.  Drop arm test negative.  Cross body test is good. Extremities - peripheral pulses normal, no pedal edema, no clubbing or cyanosis   ASSESSMENT AND PLAN: 1. Acute pain of right shoulder Suspect subacromial bursitis.  Give a trial of Naprosyn as needed.  If no improvement after a week, advised him to call me back so that we can refer to orthopedics for injection. - naproxen (NAPROSYN) 500 MG tablet; Take 1 tablet (500 mg total) by mouth 2 (two) times daily with a meal.  Dispense: 20 tablet; Refill: 0  2. Essential hypertension Not at goal.  Increase amlodipine to 10 mg daily.  Continue to check blood sugars with goal of 130/80 or lower. - amLODipine (NORVASC) 10 MG tablet; Take 1 tablet (10 mg total) by mouth daily.  Dispense: 90 tablet; Refill: 3  3. Colon cancer screening - Ambulatory referral to Gastroenterology  4. Influenza vaccination declined   5. Pneumococcal vaccination declined      Patient was given the opportunity to ask questions.  Patient verbalized understanding of the plan and was able to repeat key elements of the plan.   No orders of the defined types were placed in this encounter.    Requested Prescriptions    No prescriptions requested or ordered in this encounter    No follow-ups on file.  Jonah Blue, MD, FACP

## 2018-08-22 MED FILL — LISINOPRIL 10 MG TABS: 10 | 30 days supply | Qty: 30 | Fill #6

## 2018-08-28 ENCOUNTER — Encounter: Payer: Self-pay | Admitting: Gastroenterology

## 2018-09-14 ENCOUNTER — Ambulatory Visit: Payer: Medicare HMO | Attending: Internal Medicine | Admitting: Internal Medicine

## 2018-09-19 MED FILL — LISINOPRIL 10 MG TABS: 10 | 30 days supply | Qty: 30 | Fill #7

## 2018-09-21 ENCOUNTER — Ambulatory Visit: Payer: Medicare HMO | Attending: Internal Medicine | Admitting: Physician Assistant

## 2018-09-21 ENCOUNTER — Other Ambulatory Visit: Payer: Self-pay

## 2018-09-21 DIAGNOSIS — I10 Essential (primary) hypertension: Secondary | ICD-10-CM

## 2018-09-21 DIAGNOSIS — M25511 Pain in right shoulder: Secondary | ICD-10-CM

## 2018-09-21 MED ORDER — LISINOPRIL 10 MG PO TABS: 10.0000 mg | ORAL_TABLET | Freq: Every day | ORAL | 3 refills | Status: DC

## 2018-09-21 MED ORDER — METHOCARBAMOL 500 MG PO TABS: 500.0000 mg | ORAL_TABLET | Freq: Three times a day (TID) | ORAL | 1 refills | Status: DC | PRN

## 2018-09-21 MED ORDER — NAPROXEN 500 MG PO TABS: 500.0000 mg | ORAL_TABLET | Freq: Two times a day (BID) | ORAL | 1 refills | Status: DC

## 2018-09-21 MED FILL — NAPROXEN 500 MG TABLET: 500 | 30 days supply | Qty: 60 | Fill #0

## 2018-09-21 MED FILL — METHOCARBAMOL 500 MG TABS: 500 | 30 days supply | Qty: 90 | Fill #0

## 2018-09-21 NOTE — Progress Notes (Signed)
cont'd right shoulder discomfort at a certain position. Limited ROM.  Has completed naproxen with no relief.

## 2018-09-21 NOTE — Progress Notes (Signed)
Patient ID: Michael Mooney, male   DOB: 09/28/1951, 67 y.o.   MRN: [id number]         Michael Mooney, is a 67 y.o. male  [id number]  [id number]  DOB - 03/12/1951  Subjective:  Chief Complaint and HPI: Michael Mooney is a 67 y.o. male here today with continued R shoulder pain.  Naprosyn helped some(see last note).  Most pain is if he tries to lift his R shoulder past shoulder height.  Pain does not increase at night.  No fever.  No weakness or paresthesias.  NKI  ROS:   Constitutional:  No f/c, No night sweats, No unexplained weight loss. EENT:  No vision changes, No blurry vision, No hearing changes. No mouth, throat, or ear problems.  Respiratory: No cough, No SOB Cardiac: No CP, no palpitations GI:  No abd pain, No N/V/D. GU: No Urinary s/sx Musculoskeletal: R shoulder pain Neuro: No headache, no dizziness, no motor weakness.  Skin: No rash Endocrine:  No polydipsia. No polyuria.  Psych: Denies SI/HI  No problems updated.  ALLERGIES: No Known Allergies  PAST MEDICAL HISTORY: Past Medical History:  Diagnosis Date  . Hypertension     MEDICATIONS AT HOME: Prior to Admission medications   Medication Sig Start Date End Date Taking? Authorizing Provider  amLODipine (NORVASC) 10 MG tablet Take 1 tablet (10 mg total) by mouth daily. 08/17/18  Yes Marcine MatarJohnson, Deborah B, MD  aspirin EC 81 MG tablet Take 1 tablet (81 mg total) by mouth daily. 06/29/17  Yes Hairston, Mandesia R, FNP  lisinopril (PRINIVIL,ZESTRIL) 10 MG tablet Take 1 tablet (10 mg total) by mouth daily. 09/21/18   Anders SimmondsMcClung,  M, PA-C  methocarbamol (ROBAXIN) 500 MG tablet Take 1 tablet (500 mg total) by mouth every 8 (eight) hours as needed for muscle spasms. 09/21/18   Anders SimmondsMcClung,  M, PA-C  naproxen (NAPROSYN) 500 MG tablet Take 1 tablet (500 mg total) by mouth 2 (two) times daily with a meal. Prn pain 09/21/18   Anders SimmondsMcClung,  M, PA-C     Objective:  EXAM:   Vitals:   09/21/18 0924  BP: (!)  146/96  Pulse: 79  Resp: 20  Temp: 98.3 F (36.8 C)  TempSrc: Oral  SpO2: 97%  Weight: 189 lb (85.7 kg)    General appearance : A&OX3. NAD. Non-toxic-appearing HEENT: Atraumatic and Normocephalic.  PERRLA. EOM intact.   Neck: supple, no JVD. No cervical lymphadenopathy. No thyromegaly Chest/Lungs:  Breathing-non-labored, Good air entry bilaterally, breath sounds normal without rales, rhonchi, or wheezing  CVS: S1 S2 regular, no murmurs, gallops, rubs  R shoulder-no effusion erythema or swelling.  No point TTP.  ROM limited to 50% with abduction laterally.  Posterior rotation possible but painful.  Neg speed's and empty can test.   Extremities: Bilateral Lower Ext shows no edema, both legs are warm to touch with = pulse throughout Neurology:  CN II-XII grossly intact, Non focal.   Psych:  TP linear. J/I WNL. Normal speech. Appropriate eye contact and affect.  Skin:  No Rash  Data Review Lab Results  Component Value Date   HGBA1C 5.1 01/30/2018     Assessment & Plan   1. Acute pain of right shoulder - Ambulatory referral to Orthopedic Surgery - methocarbamol (ROBAXIN) 500 MG tablet; Take 1 tablet (500 mg total) by mouth every 8 (eight) hours as needed for muscle spasms.  Dispense: 90 tablet; Refill: 1 - naproxen (NAPROSYN) 500 MG tablet; Take 1 tablet (500 mg total) by mouth  2 (two) times daily with a meal. Prn pain  Dispense: 60 tablet; Refill: 1  2. Essential hypertension Uncontrolled-was out of lisinopril.  Continue amlodipine.  Resume Lisinopril.  - lisinopril (PRINIVIL,ZESTRIL) 10 MG tablet; Take 1 tablet (10 mg total) by mouth daily.  Dispense: 90 tablet; Refill: 3   Patient have been counseled extensively about nutrition and exercise  Return in about 3 months (around 12/21/2018) for dr Laural Benes.  The patient was given clear instructions to go to ER or return to medical center if symptoms don't improve, worsen or new problems develop. The patient verbalized  understanding. The patient was told to call to get lab results if they haven't heard anything in the next week.     Georgian Co, PA-C [facility] [place], [place] [phone]   09/21/2018, 9:46 AM

## 2018-09-25 ENCOUNTER — Telehealth: Payer: Self-pay

## 2018-09-25 NOTE — Telephone Encounter (Signed)
Patient No Showed for PV. Patient was called several times today to reschedule his appointment. Messages were left as well. No response. A no show letter will be mailed today and his colonoscopy  that is scheduled for 10/09/18 will be cancelled. Messages left on voice mail encouraged patient to call and reschedule both appointments.   Janalee DaneNancy Ebrima Ranta, LPN ( PV )

## 2018-09-26 ENCOUNTER — Ambulatory Visit (INDEPENDENT_AMBULATORY_CARE_PROVIDER_SITE_OTHER): Payer: Self-pay | Admitting: Family Medicine

## 2018-09-27 ENCOUNTER — Encounter (INDEPENDENT_AMBULATORY_CARE_PROVIDER_SITE_OTHER): Payer: Self-pay | Admitting: Family Medicine

## 2018-09-27 ENCOUNTER — Ambulatory Visit (INDEPENDENT_AMBULATORY_CARE_PROVIDER_SITE_OTHER): Payer: Self-pay | Admitting: Family Medicine

## 2018-09-27 ENCOUNTER — Ambulatory Visit (INDEPENDENT_AMBULATORY_CARE_PROVIDER_SITE_OTHER): Payer: Medicare HMO

## 2018-09-27 ENCOUNTER — Ambulatory Visit (INDEPENDENT_AMBULATORY_CARE_PROVIDER_SITE_OTHER): Payer: Medicare HMO | Admitting: Family Medicine

## 2018-09-27 DIAGNOSIS — M25511 Pain in right shoulder: Secondary | ICD-10-CM

## 2018-09-27 MED ORDER — METHYLPREDNISOLONE ACETATE 40 MG/ML IJ SUSP
40.0000 mg | INTRAMUSCULAR | Status: AC | PRN
  Administered 2018-09-27: 40 mg via INTRA_ARTICULAR

## 2018-09-27 MED ORDER — LIDOCAINE HCL 1 % IJ SOLN
5.0000 mL | INTRAMUSCULAR | Status: AC | PRN
  Administered 2018-09-27: 5 mL

## 2018-09-27 NOTE — Progress Notes (Signed)
   Office Visit Note   Patient: Michael LentCharles J Capitano           Date of Birth: 07/10/1951           MRN: [id number] Visit Date: 09/27/2018 Requested by: Anders SimmondsMcClung, Angela M, PA-C [address] [place], [place] [address] PCP: Marcine MatarJohnson, Deborah B, MD  Subjective: Chief Complaint  Patient presents with  . Right Shoulder - Pain    Pain in shoulder/upper arm x 1 month, mainly with arm movements above shoulder level. NKI. Does not disturb sleep. No pain with no activity.    HPI: He is a 67 year old right-hand-dominant male with right shoulder pain.  Symptoms started about a month ago, no definite injury but he thinks that perhaps the night before, he might of fallen out of a chair.  He has been having popping and pain with reaching laterally and overhead.  Sometimes it hurts at night.  He tried naproxen with temporary improvement.  No previous problems with his shoulder.               ROS: He has hypertension.  Other systems were negative.  Objective: Vital Signs: There were no vitals taken for this visit.  Physical Exam:  Right shoulder: Full active range of motion, pain reaching overhead with some palpable crepitus.  No tenderness at the [facility], mild tenderness in the posterior subacromial space.  Isometric rotator cuff strength is 5/5 except for external rotation which is 4+/5.  Slight pain with external rotation against resistance.  Speeds test is negative.  Imaging: X-rays right shoulder: Moderate AC joint DJD, irregularity of the greater tuberosity of the humerus but no definite fracture.  Glenohumeral joint is unremarkable.  Assessment & Plan: 1.  Right shoulder pain, suspect impingement but cannot rule out infraspinatus tear -Discussed options with him and elected to try a subacromial injection.  If no improvement, then MRI scan.   Follow-Up Instructions: No follow-ups on file.      Procedures: Large Joint Inj: R subacromial bursa on 09/27/2018 4:38 PM Indications:  pain Details: 25 G 1.5 in needle, posterior approach  Arthrogram: No  Medications: 5 mL lidocaine 1 %; 40 mg methylPREDNISolone acetate 40 MG/ML Consent was given by the patient.      No notes on file    PMFS History: Patient Active Problem List   Diagnosis Date Noted  . At risk for coronary artery disease 02/21/2017  . HTN (hypertension) 12/18/2014   Past Medical History:  Diagnosis Date  . Hypertension     Family History  Problem Relation Age of Onset  . Hypertension Mother     History reviewed. No pertinent surgical history. Social History   Occupational History  . Not on file  Tobacco Use  . Smoking status: Never Smoker  . Smokeless tobacco: Never Used  Substance and Sexual Activity  . Alcohol use: Yes    Alcohol/week: 6.0 standard drinks    Types: 6 Standard drinks or equivalent per week  . Drug use: No  . Sexual activity: Not on file

## 2018-10-09 ENCOUNTER — Encounter: Payer: Medicare HMO | Admitting: Gastroenterology

## 2018-10-18 MED FILL — LISINOPRIL 10 MG TABS: 10 | 30 days supply | Qty: 30 | Fill #8

## 2018-11-08 MED FILL — NAPROXEN 500 MG TABLET: 500 | 30 days supply | Qty: 60 | Fill #1

## 2018-11-08 MED FILL — METHOCARBAMOL 500 MG TABS: 500 | 30 days supply | Qty: 90 | Fill #1

## 2018-11-21 MED FILL — LISINOPRIL 10 MG TABS: 10 | 30 days supply | Qty: 30 | Fill #9

## 2018-11-22 MED FILL — AMLODIPINE BESYLATE 10 MG T: 10 | 90 days supply | Qty: 90 | Fill #1

## 2018-12-19 MED FILL — LISINOPRIL 10 MG TABS: 10 | 30 days supply | Qty: 30 | Fill #10

## 2018-12-27 ENCOUNTER — Other Ambulatory Visit: Payer: Self-pay

## 2018-12-27 DIAGNOSIS — M25511 Pain in right shoulder: Secondary | ICD-10-CM

## 2018-12-27 DIAGNOSIS — I10 Essential (primary) hypertension: Secondary | ICD-10-CM

## 2018-12-27 MED ORDER — AMLODIPINE BESYLATE 10 MG PO TABS: 10.0000 mg | ORAL_TABLET | Freq: Every day | ORAL | 1 refills | Status: DC

## 2018-12-27 MED ORDER — LISINOPRIL 10 MG PO TABS: 10.0000 mg | ORAL_TABLET | Freq: Every day | ORAL | 1 refills | Status: DC

## 2018-12-27 MED ORDER — METHOCARBAMOL 500 MG PO TABS: 500.0000 mg | ORAL_TABLET | Freq: Three times a day (TID) | ORAL | 0 refills | Status: DC | PRN

## 2018-12-27 MED ORDER — NAPROXEN 500 MG PO TABS: 500.0000 mg | ORAL_TABLET | Freq: Two times a day (BID) | ORAL | 1 refills | Status: DC | PRN

## 2019-01-03 ENCOUNTER — Other Ambulatory Visit: Payer: Self-pay | Admitting: Pharmacist

## 2019-01-03 DIAGNOSIS — M25511 Pain in right shoulder: Secondary | ICD-10-CM

## 2019-01-03 DIAGNOSIS — I10 Essential (primary) hypertension: Secondary | ICD-10-CM

## 2019-01-03 MED ORDER — NAPROXEN 500 MG PO TABS: 500.0000 mg | ORAL_TABLET | Freq: Two times a day (BID) | ORAL | 1 refills | Status: DC | PRN

## 2019-01-03 MED ORDER — METHOCARBAMOL 500 MG PO TABS: 500.0000 mg | ORAL_TABLET | Freq: Three times a day (TID) | ORAL | 0 refills | Status: AC | PRN

## 2019-01-03 MED ORDER — LISINOPRIL 10 MG PO TABS: 10.0000 mg | ORAL_TABLET | Freq: Every day | ORAL | 0 refills | Status: DC

## 2019-01-03 MED ORDER — AMLODIPINE BESYLATE 10 MG PO TABS: 10.0000 mg | ORAL_TABLET | Freq: Every day | ORAL | 0 refills | Status: DC

## 2019-01-16 ENCOUNTER — Telehealth: Payer: Self-pay | Admitting: Internal Medicine

## 2019-01-16 NOTE — Telephone Encounter (Signed)
Contacted pt to go over Dr. Laural Benes message and to schedule pt an appointment with Dr. Laural Benes. Pt didn't answer lvm asking pt to give me a call at his earliest convenience

## 2019-01-16 NOTE — Telephone Encounter (Signed)
New Message   Pt states he is experiencing different bp readings than normal and also a high heart rate. Please f/u

## 2019-01-16 NOTE — Telephone Encounter (Signed)
Returned pt call. Pt states he is not able to donate plasma because of his pulse. Pt states the people at the plasma place told him that he needs to see his pcp and get a letter so he can donate   Pt sates his pulse this morning was 74  Pt states his bp 1127/62  pulse 85

## 2019-01-17 NOTE — Telephone Encounter (Signed)
Pt is schedule to see Dr. Laural Benes on 01/22/19

## 2019-01-22 ENCOUNTER — Ambulatory Visit: Payer: Medicare HMO | Admitting: Internal Medicine

## 2019-01-24 ENCOUNTER — Encounter: Payer: Self-pay | Admitting: Family Medicine

## 2019-01-24 ENCOUNTER — Ambulatory Visit: Payer: Medicare PPO | Attending: Family Medicine | Admitting: Family Medicine

## 2019-01-24 ENCOUNTER — Other Ambulatory Visit: Payer: Self-pay

## 2019-01-24 VITALS — BP 179/66 | HR 107 | Temp 98.6°F | Ht 68.0 in | Wt 194.8 lb

## 2019-01-24 DIAGNOSIS — R000 Tachycardia, unspecified: Secondary | ICD-10-CM

## 2019-01-24 DIAGNOSIS — I10 Essential (primary) hypertension: Secondary | ICD-10-CM | POA: Diagnosis not present

## 2019-01-24 MED ORDER — CARVEDILOL 3.125 MG PO TABS: 3.1250 mg | ORAL_TABLET | Freq: Two times a day (BID) | ORAL | 3 refills | Status: DC

## 2019-01-24 MED FILL — CARVEDILOL 3.125 MG TABLET: 3.125 | 30 days supply | Qty: 60 | Fill #0

## 2019-01-24 NOTE — Progress Notes (Signed)
Subjective:  Patient ID: Michael Mooney, male    DOB: 11/04/1950  Age: 68 y.o. MRN: [id number]  CC: Hypertension   HPI Michael Mooney is a 68 year old male with a history of hypertension who presents today stating he was unable to donate plasma due to elevated heart rate. His blood pressure is also significantly elevated and he endorses compliance with his antihypertensives. [place] At his last office visit his blood pressure was 150/85 and heart rate was 79.  Past Medical History:  Diagnosis Date  . Hypertension     History reviewed. No pertinent surgical history.  Family History  Problem Relation Age of Onset  . Hypertension Mother     No Known Allergies  Outpatient Medications Prior to Visit  Medication Sig Dispense Refill  . amLODipine (NORVASC) 10 MG tablet Take 1 tablet (10 mg total) by mouth daily. 90 tablet 0  . aspirin EC 81 MG tablet Take 1 tablet (81 mg total) by mouth daily. 90 tablet 2  . lisinopril (PRINIVIL,ZESTRIL) 10 MG tablet Take 1 tablet (10 mg total) by mouth daily. 90 tablet 0  . methocarbamol (ROBAXIN) 500 MG tablet Take 1 tablet (500 mg total) by mouth every 8 (eight) hours as needed for muscle spasms. 40 tablet 0  . naproxen (NAPROSYN) 500 MG tablet Take 1 tablet (500 mg total) by mouth 2 (two) times daily as needed. Prn pain 60 tablet 1   No facility-administered medications prior to visit.      ROS Review of Systems  Constitutional: Negative for activity change and appetite change.  HENT: Negative for sinus pressure and sore throat.   Eyes: Negative for visual disturbance.  Respiratory: Negative for cough, chest tightness and shortness of breath.   Cardiovascular: Negative for chest pain and leg swelling.  Gastrointestinal: Negative for abdominal distention, abdominal pain, constipation and diarrhea.  Endocrine: Negative.   Genitourinary: Negative for dysuria.  Musculoskeletal:  Negative for joint swelling and myalgias.  Skin: Negative for rash.  Allergic/Immunologic: Negative.   Neurological: Negative for weakness, light-headedness and numbness.  Psychiatric/Behavioral: Negative for dysphoric mood and suicidal ideas.    Objective:  BP (!) 179/66   Pulse (!) 107   Temp 98.6 F (37 C) (Oral)   Ht 5\' 8"  (1.727 m)   Wt 194 lb 12.8 oz (88.4 kg)   SpO2 98%   BMI 29.62 kg/m   BP/Weight 01/24/2019 09/21/2018 08/17/2018  Systolic BP 179 150 151  Diastolic BP 66 85 86  Wt. (Lbs) 194.8 189 180  BMI 29.62 28.74 27.37      Physical Exam Constitutional:      Appearance: He is well-developed.  Cardiovascular:     Rate and Rhythm: Normal rate.     Heart sounds: Normal heart sounds. No murmur.  Pulmonary:     Effort: Pulmonary effort is normal.     Breath sounds: Normal breath sounds. No wheezing or rales.  Chest:     Chest wall: No tenderness.  Abdominal:     General: Bowel sounds are normal. There is no distension.     Palpations: Abdomen is soft. There is no mass.     Tenderness: There is no abdominal tenderness.  Musculoskeletal: Normal range of motion.  Neurological:     Mental Status: He is alert and oriented to person, place, and time.     CMP Latest Ref Rng & Units 01/30/2018 10/28/2017 02/14/2017  Glucose 65 - 99  mg/dL 94 414(E) 86  BUN 8 - 27 mg/dL 14 10 8   Creatinine 0.76 - 1.27 mg/dL 3.95 3.20 2.33  Sodium 134 - 144 mmol/L 137 139 136  Potassium 3.5 - 5.2 mmol/L 3.8 3.3(L) 4.2  Chloride 96 - 106 mmol/L 94(L) 96 93(L)  CO2 20 - 29 mmol/L 25 27 23   Calcium 8.6 - 10.2 mg/dL 9.4 9.2 8.8  Total Protein 6.0 - 8.5 g/dL 6.9 - 7.4  Total Bilirubin 0.0 - 1.2 mg/dL 0.3 - 0.6  Alkaline Phos 39 - 117 IU/L 63 - 62  AST 0 - 40 IU/L 26 - 27  ALT 0 - 44 IU/L 30 - 26    Lipid Panel     Component Value Date/Time   CHOL 169 01/30/2018 0952   TRIG 167 (H) 01/30/2018 0952   HDL 59 01/30/2018 0952   CHOLHDL 2.9 01/30/2018 0952   CHOLHDL 2.5 02/18/2016  1019   VLDL 52 (H) 02/18/2016 1019   LDLCALC 77 01/30/2018 0952    CBC    Component Value Date/Time   WBC 5.6 01/30/2018 0952   WBC 4.5 05/06/2014 1001   RBC 4.20 01/30/2018 0952   RBC 4.15 (L) 05/06/2014 1001   HGB 13.3 01/30/2018 0952   HCT 39.6 01/30/2018 0952   PLT 258 01/30/2018 0952   MCV 94 01/30/2018 0952   MCH 31.7 01/30/2018 0952   MCH 32.3 05/06/2014 1001   MCHC 33.6 01/30/2018 0952   MCHC 35.4 05/06/2014 1001   RDW 12.8 01/30/2018 0952    Lab Results  Component Value Date   HGBA1C 5.1 01/30/2018    Assessment & Plan:   1. Essential hypertension Uncontrolled Recent exercise could also contribute to elevated blood pressure however his blood pressure was previously suboptimal Coreg added to regimen Unable to provide letter for donation of plasma as he does not meet the criteria required by donation center I have advised him at his next visit with his PCP, BP will be reassessed and letter addressed - carvedilol (COREG) 3.125 MG tablet; Take 1 tablet (3.125 mg total) by mouth 2 (two) times daily with a meal.  Dispense: 60 tablet; Refill: 3  2. Tachycardia Heart rate at previous office visits have been normal Hopefully addition of beta-blocker will help with this   Meds ordered this encounter  Medications  . carvedilol (COREG) 3.125 MG tablet    Sig: Take 1 tablet (3.125 mg total) by mouth 2 (two) times daily with a meal.    Dispense:  60 tablet    Refill:  3    Follow-up: Return in about 15 days (around 02/08/2019) for letter for plasma donation and follow up on Hypertension.       Hoy Register, MD, FAAFP. [facility] [place], [place] [phone]   01/24/2019, 3:14 PM

## 2019-01-24 NOTE — Progress Notes (Signed)
Patient needs letter to donate plasma due to high pulse.

## 2019-02-08 ENCOUNTER — Ambulatory Visit: Payer: Medicare PPO | Attending: Internal Medicine | Admitting: Internal Medicine

## 2019-02-08 ENCOUNTER — Other Ambulatory Visit: Payer: Self-pay

## 2019-02-08 DIAGNOSIS — R000 Tachycardia, unspecified: Secondary | ICD-10-CM

## 2019-02-08 DIAGNOSIS — I10 Essential (primary) hypertension: Secondary | ICD-10-CM

## 2019-02-08 MED ORDER — CARVEDILOL 6.25 MG PO TABS: 6.2500 mg | ORAL_TABLET | Freq: Two times a day (BID) | ORAL | 2 refills | Status: AC

## 2019-02-08 NOTE — Progress Notes (Signed)
Pt states he is just needing a letter to go and donate plasma

## 2019-02-08 NOTE — Progress Notes (Signed)
Virtual Visit via Telephone Note  I connected with Michael Mooney on 02/08/19 at 3:10 p.m EDT by telephone from my office and verified that I am speaking with the correct person using two identifiers.  Patient is at home.  Just the pt and myself participated in this encounter.   I discussed the limitations, risks, security and privacy concerns of performing an evaluation and management service by telephone and the availability of in person appointments. I also discussed with the patient that there may be a patient responsible charge related to this service. The patient expressed understanding and agreed to proceed.   History of Present Illness: Pt with hx of HTN  HTN: donating plasma twice a wk. on a recent visit there they refused to allow him to donate plasma because his pulse rate was a little elevated.  Since then he came and followed up with Dr. Alvis Lemmings about 2 weeks ago.  He was mildly tachycardic and blood pressure was not at goal.  She added carvedilol to his medications.  He reports compliance with all 3 blood pressure medicines.  He has been checking his blood pressure with pulse about twice a day.  He gives some readings:  BP180/72 pulse 71, 137/65 P70, 138/66 P69,  -pt denies palpitations, wgh loss, diarrhea -no HA/dizziness/SOB/CP/LE edema   Observations/Objective: Pt did BP and pulse check while on the phone with me.  Gave reading of 174/82, P 84 Wt Readings from Last 3 Encounters:  01/24/19 194 lb 12.8 oz (88.4 kg)  09/21/18 189 lb (85.7 kg)  08/17/18 180 lb (81.6 kg)    Assessment and Plan: 1. Essential hypertension Home blood pressure readings not at goal.  Pulse no longer in the tachycardic range.  I recommend increasing carvedilol to 6.25 mg twice a day.  New prescription will be sent to his pharmacy. Continue to monitor home blood pressure with goal of 130/80 or lower. Follow-up with clinical pharmacist in 2 weeks for repeat blood pressure check Letter written  allowing him to continue to donate plasma - carvedilol (COREG) 6.25 MG tablet; Take 1 tablet (6.25 mg total) by mouth 2 (two) times daily with a meal.  Dispense: 180 tablet; Refill: 2 - CBC; Future - Comprehensive metabolic panel; Future - Lipid panel; Future  2. Tachycardia He is no longer tachycardic   Follow Up Instructions: Patient to follow-up with clinical pharmacist in 2 weeks for repeat blood pressure check. Follow-up with me in 3 months.   I discussed the assessment and treatment plan with the patient. The patient was provided an opportunity to ask questions and all were answered. The patient agreed with the plan and demonstrated an understanding of the instructions.   The patient was advised to call back or seek an in-person evaluation if the symptoms worsen or if the condition fails to improve as anticipated.  I provided 14 minutes of non-face-to-face time during this encounter.   Jonah Blue, MD

## 2019-02-09 DIAGNOSIS — I10 Essential (primary) hypertension: Secondary | ICD-10-CM | POA: Diagnosis not present

## 2019-02-10 LAB — COMPREHENSIVE METABOLIC PANEL
ALT: 30 IU/L (ref 0–44)
AST: 24 IU/L (ref 0–40)
Albumin/Globulin Ratio: 1.4 (ref 1.2–2.2)
Albumin: 4.2 g/dL (ref 3.8–4.8)
Alkaline Phosphatase: 81 IU/L (ref 39–117)
BUN/Creatinine Ratio: 11 (ref 10–24)
BUN: 11 mg/dL (ref 8–27)
Bilirubin Total: 0.3 mg/dL (ref 0.0–1.2)
CO2: 24 mmol/L (ref 20–29)
Calcium: 9.7 mg/dL (ref 8.6–10.2)
Chloride: 103 mmol/L (ref 96–106)
Creatinine, Ser: 0.97 mg/dL (ref 0.76–1.27)
GFR calc Af Amer: 94 mL/min/{1.73_m2} (ref >59)
GFR calc non Af Amer: 82 mL/min/{1.73_m2} (ref >59)
Globulin, Total: 3 g/dL (ref 1.5–4.5)
Glucose: 110 mg/dL — ABNORMAL HIGH (ref 65–99)
Potassium: 4.3 mmol/L (ref 3.5–5.2)
Sodium: 141 mmol/L (ref 134–144)
Total Protein: 7.2 g/dL (ref 6.0–8.5)

## 2019-02-10 LAB — CBC
Hematocrit: 36 % — ABNORMAL LOW (ref 37.5–51.0)
Hemoglobin: 12.6 g/dL — ABNORMAL LOW (ref 13.0–17.7)
MCH: 32.7 pg (ref 26.6–33.0)
MCHC: 35 g/dL (ref 31.5–35.7)
MCV: 94 fL (ref 79–97)
Platelets: 293 10*3/uL (ref 150–450)
RBC: 3.85 x10E6/uL — ABNORMAL LOW (ref 4.14–5.80)
RDW: 11.4 % — ABNORMAL LOW (ref 11.6–15.4)
WBC: 5 10*3/uL (ref 3.4–10.8)

## 2019-02-10 LAB — LIPID PANEL
Chol/HDL Ratio: 3.3 ratio (ref 0.0–5.0)
Cholesterol, Total: 166 mg/dL (ref 100–199)
HDL: 51 mg/dL (ref >39)
LDL Calculated: 75 mg/dL (ref 0–99)
Triglycerides: 201 mg/dL — ABNORMAL HIGH (ref 0–149)
VLDL Cholesterol Cal: 40 mg/dL (ref 5–40)

## 2019-02-14 ENCOUNTER — Other Ambulatory Visit: Payer: Self-pay | Admitting: Internal Medicine

## 2019-02-14 DIAGNOSIS — D649 Anemia, unspecified: Secondary | ICD-10-CM

## 2019-02-22 ENCOUNTER — Other Ambulatory Visit: Payer: Self-pay

## 2019-02-22 ENCOUNTER — Encounter: Payer: Self-pay | Admitting: Pharmacist

## 2019-02-22 ENCOUNTER — Telehealth: Payer: Self-pay | Admitting: Internal Medicine

## 2019-02-22 ENCOUNTER — Ambulatory Visit: Payer: Medicare PPO | Attending: Family Medicine | Admitting: Pharmacist

## 2019-02-22 VITALS — BP 116/71 | HR 93

## 2019-02-22 DIAGNOSIS — I10 Essential (primary) hypertension: Secondary | ICD-10-CM

## 2019-02-22 NOTE — Telephone Encounter (Signed)
Patient dropped off consent form for plasma will drop off in PCP box

## 2019-02-22 NOTE — Patient Instructions (Signed)
Thank you for coming to see us today.   Blood pressure today is at goal.   Continue taking blood pressure medications as prescribed.   Limiting salt and caffeine, as well as exercising as able for at least 30 minutes for 5 days out of the week, can also help you lower your blood pressure.  Take your blood pressure at home if you are able. Please write down these numbers and bring them to your visits.  If you have any questions about medications, please call me [phone].  Luke  

## 2019-02-22 NOTE — Progress Notes (Signed)
   S:    PCP: Dr. Laural Benes  Patient arrives in good spirits. Presents to the clinic for hypertension management. Patient was referred by Dr. Laural Benes on 02/08/19.  Patient reports adherence with medications. Took this morning.   Denies chest pain, dyspnea, HA or blurred vision. Denies BLE edema.   Current BP Medications include:  Amlodipine 10 mg daily, carvedilol 6.25 mg BID, lisinopril 10 mg daily  Dietary habits include: limits salt; drinks coffee only 2-3 x/week Exercise habits include: "I walk every day because I don't drive" Family / Social history:  - FHx: HTN (mother) - Tobacco: never smoker - Alcohol: drinks ~6 drinks/wk  Home BP readings:  - Has device at home - Reports SBP 150s but does not rest before taking - HR in the 60s   O:  L arm after 5 minutes rest: 116/71, HR 93 Last 3 Office BP readings: BP Readings from Last 3 Encounters:  02/22/19 116/71  01/24/19 (!) 179/66  09/21/18 (!) 150/85   BMET    Component Value Date/Time   NA 141 02/09/2019 1105   K 4.3 02/09/2019 1105   CL 103 02/09/2019 1105   CO2 24 02/09/2019 1105   GLUCOSE 110 (H) 02/09/2019 1105   GLUCOSE 104 (H) 02/18/2016 1019   BUN 11 02/09/2019 1105   CREATININE 0.97 02/09/2019 1105   CREATININE 0.89 02/18/2016 1019   CALCIUM 9.7 02/09/2019 1105   GFRNONAA 82 02/09/2019 1105   GFRNONAA >89 02/18/2016 1019   GFRAA 94 02/09/2019 1105   GFRAA >89 02/18/2016 1019   Renal function: CrCl cannot be calculated (Unknown ideal weight.).  Clinical ASCVD: No  The 10-year ASCVD risk score Denman George DC Jr., et al., 2013) is: 12.8%   Values used to calculate the score:     Age: 68 years     Sex: Male     Is Non-Hispanic African American: Yes     Diabetic: No     Tobacco smoker: No     Systolic Blood Pressure: 116 mmHg     Is BP treated: Yes     HDL Cholesterol: 51 mg/dL     Total Cholesterol: 166 mg/dL  A/P: Hypertension longstanding currently at goal on current medications. BP Goal = <130/80  mmHg. Patient is adherent with current medications.  -Continued current regimen.  -F/u labs ordered - Iron, TIBC, Ferritin per PCP -Counseled on lifestyle modifications for blood pressure control including reduced dietary sodium, increased exercise, adequate sleep  Results reviewed and written information provided.   Total time in face-to-face counseling 10 minutes.   F/U Clinic Visit in 3 weeks.    Butch Penny, PharmD, CPP Clinical Pharmacist [facility] [phone]

## 2019-02-26 NOTE — Telephone Encounter (Signed)
Provider works on paperwork on admin day which is Wednesdays. Pt will receive a call when it'd ready for pickup

## 2019-02-26 NOTE — Telephone Encounter (Signed)
Patient called to check on paperwork dropped off. Informed of 7-10 business days processing.

## 2019-02-27 ENCOUNTER — Telehealth: Payer: Self-pay | Admitting: Internal Medicine

## 2019-02-28 NOTE — Telephone Encounter (Signed)
Contacted pt and went over Dr. Laural Benes message pt is scheduled for a lab visit for 5/21 at 11

## 2019-03-01 ENCOUNTER — Other Ambulatory Visit: Payer: Medicare PPO

## 2019-03-01 ENCOUNTER — Other Ambulatory Visit: Payer: Self-pay | Admitting: Internal Medicine

## 2019-03-01 DIAGNOSIS — I10 Essential (primary) hypertension: Secondary | ICD-10-CM

## 2019-03-02 ENCOUNTER — Other Ambulatory Visit: Payer: Self-pay

## 2019-03-02 ENCOUNTER — Ambulatory Visit: Payer: Medicare PPO | Attending: Internal Medicine

## 2019-03-02 DIAGNOSIS — D649 Anemia, unspecified: Secondary | ICD-10-CM

## 2019-03-03 ENCOUNTER — Other Ambulatory Visit: Payer: Self-pay | Admitting: Internal Medicine

## 2019-03-03 DIAGNOSIS — M25511 Pain in right shoulder: Secondary | ICD-10-CM

## 2019-03-03 LAB — IRON,TIBC AND FERRITIN PANEL
Ferritin: 55 ng/mL (ref 30–400)
Iron Saturation: 15 % (ref 15–55)
Iron: 58 ug/dL (ref 38–169)
Total Iron Binding Capacity: 380 ug/dL (ref 250–450)
UIBC: 322 ug/dL (ref 111–343)

## 2019-03-07 ENCOUNTER — Telehealth: Payer: Self-pay

## 2019-03-07 DIAGNOSIS — D509 Iron deficiency anemia, unspecified: Secondary | ICD-10-CM

## 2019-03-07 DIAGNOSIS — Z1211 Encounter for screening for malignant neoplasm of colon: Secondary | ICD-10-CM

## 2019-03-07 NOTE — Telephone Encounter (Signed)
Contacted pt to go over lab results pt is aware and doesn't have any questions or concerns  Pt states he is okay with the referral being placed for the GI

## 2019-05-07 ENCOUNTER — Other Ambulatory Visit: Payer: Self-pay | Admitting: Internal Medicine

## 2019-05-07 DIAGNOSIS — I10 Essential (primary) hypertension: Secondary | ICD-10-CM

## 2019-05-12 DIAGNOSIS — I10 Essential (primary) hypertension: Secondary | ICD-10-CM | POA: Diagnosis not present

## 2019-05-12 DIAGNOSIS — M25511 Pain in right shoulder: Secondary | ICD-10-CM | POA: Diagnosis not present

## 2019-05-12 DIAGNOSIS — Z7982 Long term (current) use of aspirin: Secondary | ICD-10-CM | POA: Diagnosis not present

## 2019-05-12 DIAGNOSIS — Z6827 Body mass index (BMI) 27.0-27.9, adult: Secondary | ICD-10-CM | POA: Diagnosis not present

## 2019-05-23 ENCOUNTER — Telehealth: Payer: Self-pay | Admitting: Internal Medicine

## 2019-05-23 DIAGNOSIS — Z1211 Encounter for screening for malignant neoplasm of colon: Secondary | ICD-10-CM

## 2019-05-23 NOTE — Telephone Encounter (Signed)
Patient referred to gastroenterology 02/2019.  According to the referral note patient declined to schedule the appointment for the colonoscopy.  I received a note from [facility] letting me know that they had been in contact with the patient and that he preferred to do a noninvasive colon cancer screening test.  I will have my nurse reach out to him and offer him the fit test.

## 2019-05-23 NOTE — Telephone Encounter (Signed)
Contacted pt and went over Dr. Wynetta Emery message pt states he didn't say that he wanted to do a noninvasive colon cancer screening pt states he is fine the way he is. Pt states he is not having no pain and hung up.

## 2019-07-19 ENCOUNTER — Other Ambulatory Visit: Payer: Self-pay | Admitting: Internal Medicine

## 2019-07-19 DIAGNOSIS — I10 Essential (primary) hypertension: Secondary | ICD-10-CM

## 2019-08-15 ENCOUNTER — Other Ambulatory Visit: Payer: Self-pay | Admitting: Family Medicine

## 2019-08-15 ENCOUNTER — Ambulatory Visit
Admission: RE | Admit: 2019-08-15 | Discharge: 2019-08-15 | Disposition: A | Discharge status: Home / Self Care | Payer: Medicare PPO | Source: Ambulatory Visit | Attending: Family Medicine | Admitting: Family Medicine

## 2019-08-15 DIAGNOSIS — M545 Low back pain, unspecified: Secondary | ICD-10-CM

## 2019-10-22 DIAGNOSIS — I10 Essential (primary) hypertension: Secondary | ICD-10-CM | POA: Diagnosis not present

## 2019-10-22 DIAGNOSIS — M130 Polyarthritis, unspecified: Secondary | ICD-10-CM | POA: Diagnosis not present

## 2019-10-22 DIAGNOSIS — E782 Mixed hyperlipidemia: Secondary | ICD-10-CM | POA: Diagnosis not present

## 2020-02-02 ENCOUNTER — Other Ambulatory Visit: Payer: Self-pay | Admitting: Internal Medicine

## 2020-02-02 DIAGNOSIS — I10 Essential (primary) hypertension: Secondary | ICD-10-CM

## 2020-02-11 DIAGNOSIS — I517 Cardiomegaly: Secondary | ICD-10-CM | POA: Diagnosis not present

## 2020-02-11 DIAGNOSIS — I10 Essential (primary) hypertension: Secondary | ICD-10-CM | POA: Diagnosis not present

## 2020-02-11 DIAGNOSIS — E782 Mixed hyperlipidemia: Secondary | ICD-10-CM | POA: Diagnosis not present

## 2020-02-11 DIAGNOSIS — M130 Polyarthritis, unspecified: Secondary | ICD-10-CM | POA: Diagnosis not present

## 2020-02-25 DIAGNOSIS — I517 Cardiomegaly: Secondary | ICD-10-CM | POA: Diagnosis not present

## 2020-02-25 DIAGNOSIS — E782 Mixed hyperlipidemia: Secondary | ICD-10-CM | POA: Diagnosis not present

## 2020-02-25 DIAGNOSIS — M150 Primary generalized (osteo)arthritis: Secondary | ICD-10-CM | POA: Diagnosis not present

## 2020-02-25 DIAGNOSIS — I10 Essential (primary) hypertension: Secondary | ICD-10-CM | POA: Diagnosis not present

## 2020-04-07 DIAGNOSIS — M130 Polyarthritis, unspecified: Secondary | ICD-10-CM | POA: Diagnosis not present

## 2020-04-07 DIAGNOSIS — I10 Essential (primary) hypertension: Secondary | ICD-10-CM | POA: Diagnosis not present

## 2020-04-07 DIAGNOSIS — E782 Mixed hyperlipidemia: Secondary | ICD-10-CM | POA: Diagnosis not present

## 2020-09-03 IMAGING — CR DG HIP (WITH OR WITHOUT PELVIS) 2-3V*L*
2 series · 2 of 2 positions shown · non-contrast
Comparison: Lumbar radiographs today reported separately.

CLINICAL DATA: 68-year-old male with low back pain after lifting
injury last week.

EXAM:
DG HIP (WITH OR WITHOUT PELVIS) 2-3V LEFT

[t hip ap left]
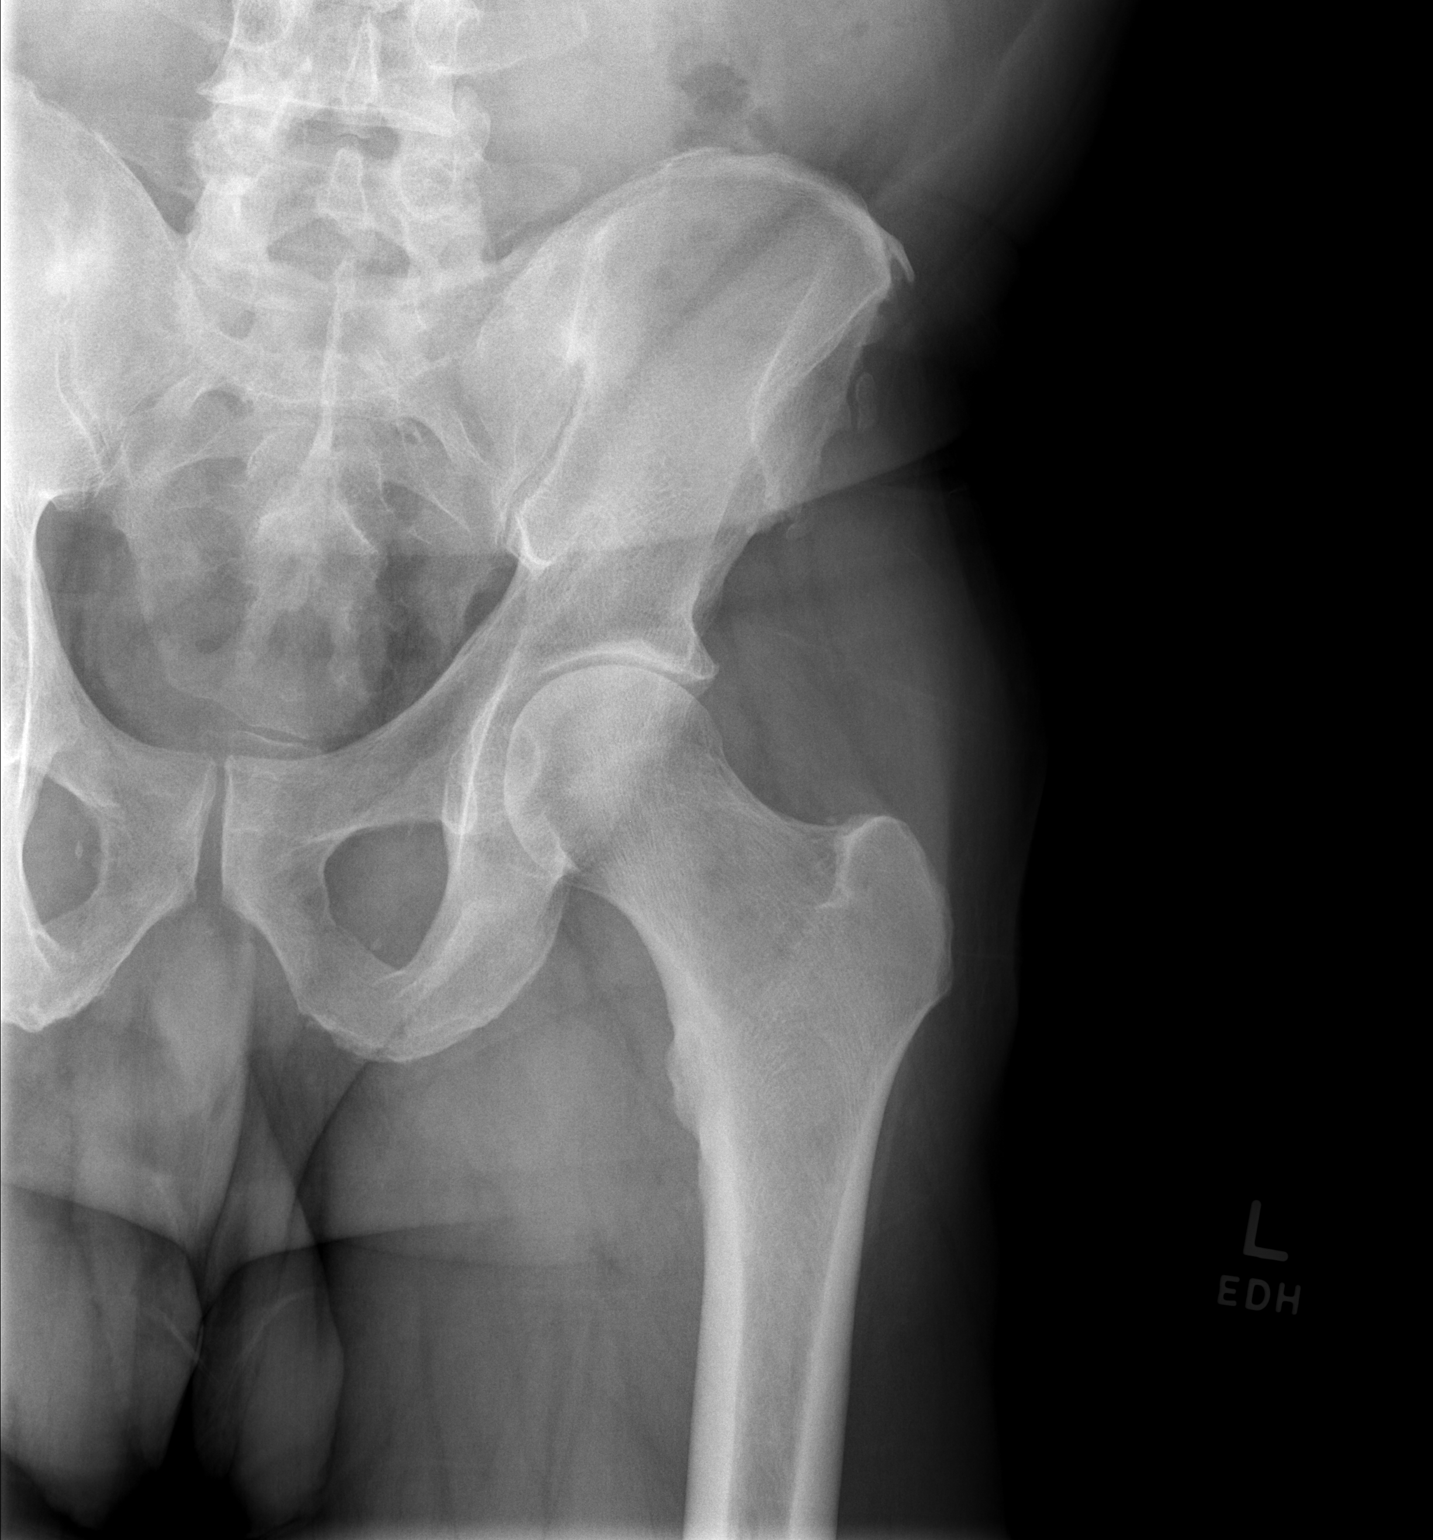

[t hip frog leg left]
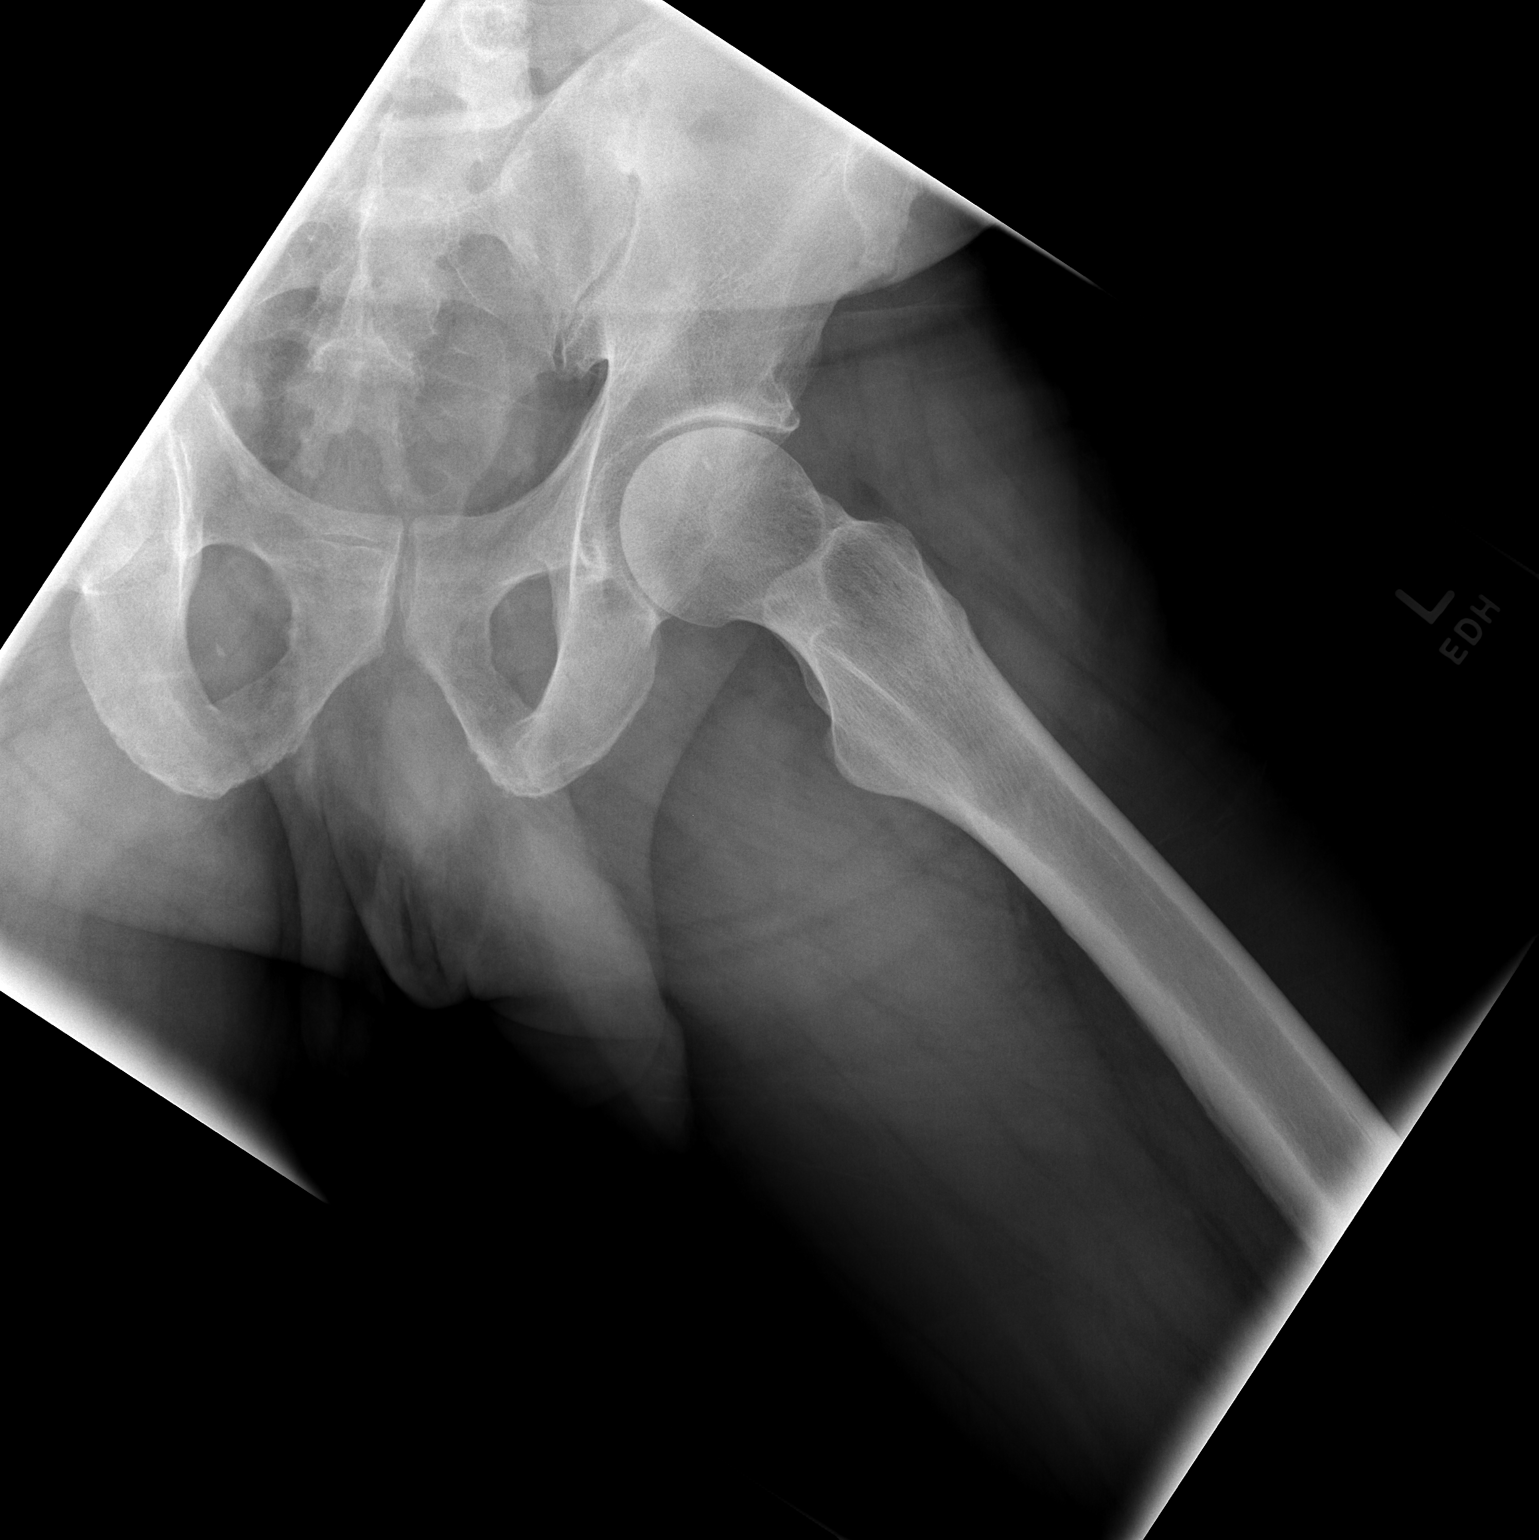

[2 of 2 positions shown; findings below may reference images not displayed]

FINDINGS: Bone mineralization is within normal limits. There is no evidence of
hip fracture or dislocation. Minimal subchondral sclerosis, no other
evidence of arthropathy or other focal bone abnormality. Visible
sacrum and SI joints appear within normal limits. Negative lower
abdominal and pelvic visceral contours.
IMPRESSION: Negative for age radiographic appearance of the left hip.

## 2020-09-03 IMAGING — CR DG LUMBAR SPINE COMPLETE 4+V
5 series · 5 of 5 positions shown · non-contrast
Comparison: Chest radiographs 02/15/2017.

CLINICAL DATA: 68-year-old male with low back pain after lifting
injury last week.

EXAM:
LUMBAR SPINE - COMPLETE 4+ VIEW

[t l-spine a.p.]
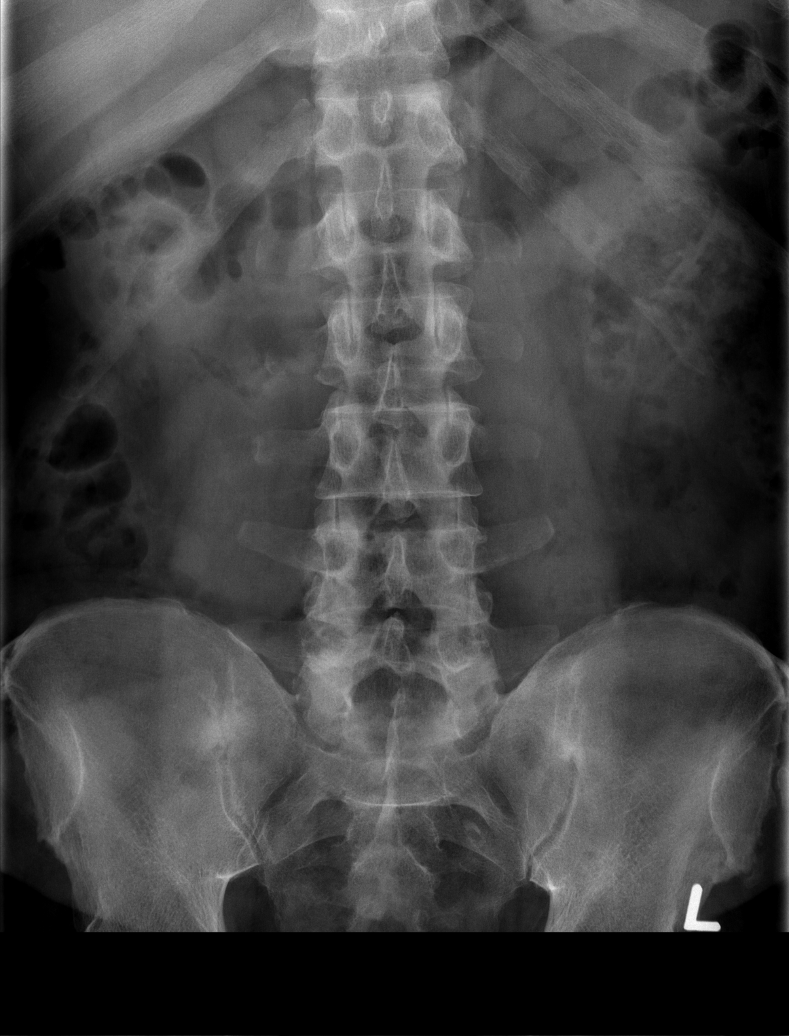

[t l-spine oblique exposure (1 of 2)]
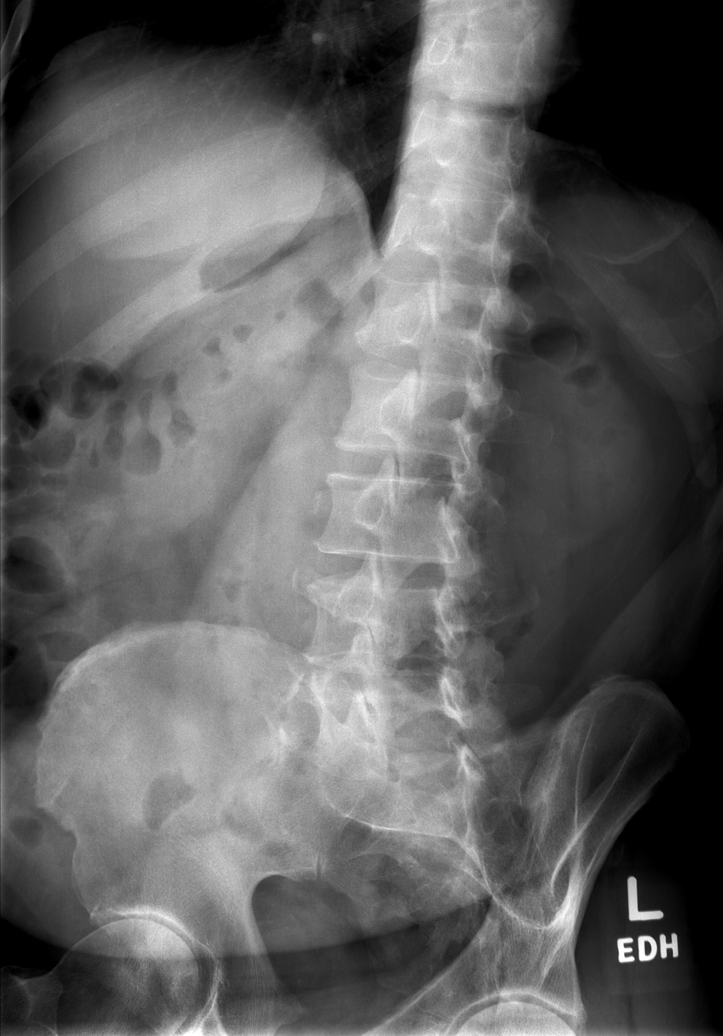

[t l-spine oblique exposure (2 of 2)]
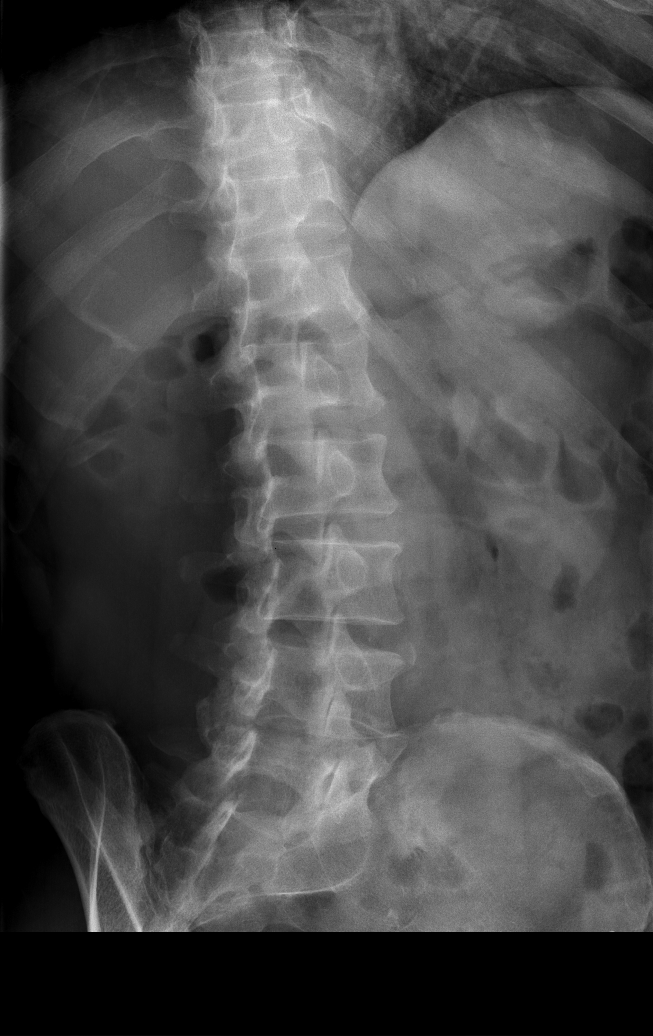

[t l-spine lat]
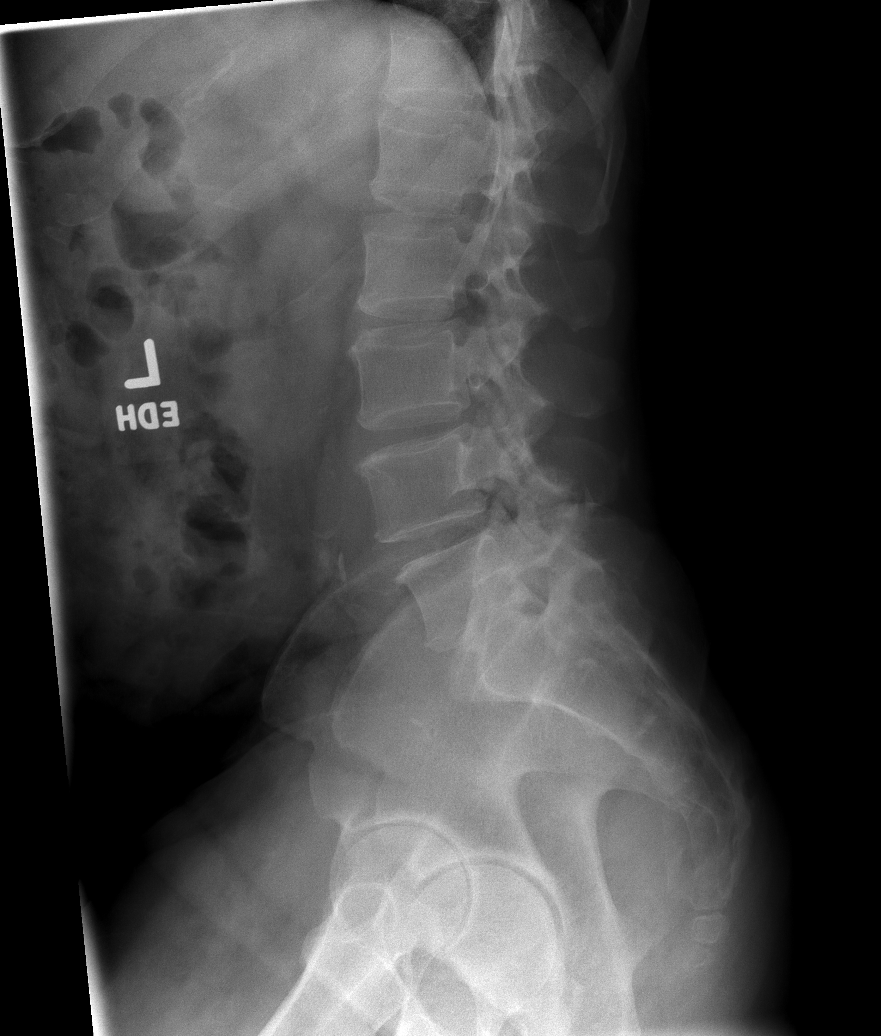

[t l-spine l5-s1 spot]
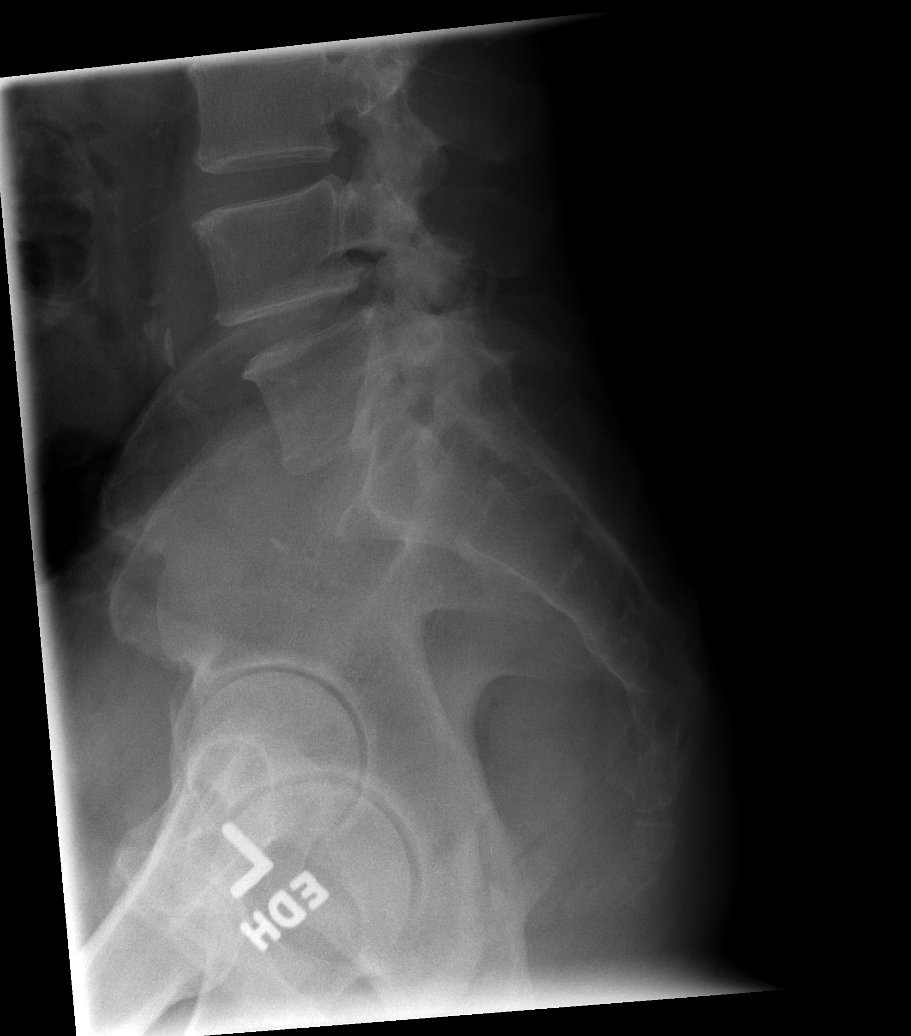

[5 of 5 positions shown; findings below may reference images not displayed]

FINDINGS: Normal lumbar segmentation. Vertebral height and alignment within
normal limits. Relatively preserved disc spaces. Generalized mild
lumbar degenerative endplate spurring. No pars fracture. Mild to
moderate lower lumbar facet hypertrophy. Visible sacrum and SI
joints appear within normal limits. Grossly intact visible lower
thoracic levels. Negative abdominal visceral contours.
IMPRESSION: 1. No acute osseous abnormality identified in the lumbar spine.
If pain symptoms persist MRI or Nuclear Medicine Whole-body Bone
Scan have the highest sensitivity for early compression fracture.
2. Mild for age lumbar disc degeneration. Mild to moderate lower
lumbar facet degeneration.

## 2021-07-10 DIAGNOSIS — E7849 Other hyperlipidemia: Secondary | ICD-10-CM | POA: Diagnosis not present

## 2021-07-10 DIAGNOSIS — M130 Polyarthritis, unspecified: Secondary | ICD-10-CM | POA: Diagnosis not present

## 2021-07-10 DIAGNOSIS — I10 Essential (primary) hypertension: Secondary | ICD-10-CM | POA: Diagnosis not present

## 2021-08-10 DIAGNOSIS — I10 Essential (primary) hypertension: Secondary | ICD-10-CM | POA: Diagnosis not present

## 2021-08-10 DIAGNOSIS — M130 Polyarthritis, unspecified: Secondary | ICD-10-CM | POA: Diagnosis not present

## 2021-08-10 DIAGNOSIS — E7849 Other hyperlipidemia: Secondary | ICD-10-CM | POA: Diagnosis not present

## 2021-10-09 DIAGNOSIS — E7849 Other hyperlipidemia: Secondary | ICD-10-CM | POA: Diagnosis not present

## 2021-10-09 DIAGNOSIS — M130 Polyarthritis, unspecified: Secondary | ICD-10-CM | POA: Diagnosis not present

## 2021-10-09 DIAGNOSIS — I10 Essential (primary) hypertension: Secondary | ICD-10-CM | POA: Diagnosis not present

## 2022-01-08 DIAGNOSIS — I10 Essential (primary) hypertension: Secondary | ICD-10-CM | POA: Diagnosis not present

## 2022-01-08 DIAGNOSIS — M130 Polyarthritis, unspecified: Secondary | ICD-10-CM | POA: Diagnosis not present

## 2022-01-08 DIAGNOSIS — E7849 Other hyperlipidemia: Secondary | ICD-10-CM | POA: Diagnosis not present

## 2022-04-09 DIAGNOSIS — E7849 Other hyperlipidemia: Secondary | ICD-10-CM | POA: Diagnosis not present

## 2022-04-09 DIAGNOSIS — I10 Essential (primary) hypertension: Secondary | ICD-10-CM | POA: Diagnosis not present

## 2022-07-10 DIAGNOSIS — E7849 Other hyperlipidemia: Secondary | ICD-10-CM | POA: Diagnosis not present

## 2022-07-10 DIAGNOSIS — I10 Essential (primary) hypertension: Secondary | ICD-10-CM | POA: Diagnosis not present

## 2022-10-09 DIAGNOSIS — E7849 Other hyperlipidemia: Secondary | ICD-10-CM | POA: Diagnosis not present

## 2022-10-09 DIAGNOSIS — I10 Essential (primary) hypertension: Secondary | ICD-10-CM | POA: Diagnosis not present

## 2022-10-29 DIAGNOSIS — B351 Tinea unguium: Secondary | ICD-10-CM | POA: Diagnosis not present

## 2022-10-29 DIAGNOSIS — Z0000 Encounter for general adult medical examination without abnormal findings: Secondary | ICD-10-CM | POA: Diagnosis not present

## 2022-10-29 DIAGNOSIS — I10 Essential (primary) hypertension: Secondary | ICD-10-CM | POA: Diagnosis not present

## 2022-10-29 DIAGNOSIS — Z0001 Encounter for general adult medical examination with abnormal findings: Secondary | ICD-10-CM | POA: Diagnosis not present

## 2022-10-29 DIAGNOSIS — E559 Vitamin D deficiency, unspecified: Secondary | ICD-10-CM | POA: Diagnosis not present

## 2022-10-29 DIAGNOSIS — Z1211 Encounter for screening for malignant neoplasm of colon: Secondary | ICD-10-CM | POA: Diagnosis not present

## 2022-10-29 DIAGNOSIS — E663 Overweight: Secondary | ICD-10-CM | POA: Diagnosis not present

## 2022-10-29 DIAGNOSIS — Z6828 Body mass index (BMI) 28.0-28.9, adult: Secondary | ICD-10-CM | POA: Diagnosis not present

## 2022-10-29 DIAGNOSIS — Z79899 Other long term (current) drug therapy: Secondary | ICD-10-CM | POA: Diagnosis not present

## 2022-11-01 DIAGNOSIS — Z1211 Encounter for screening for malignant neoplasm of colon: Secondary | ICD-10-CM | POA: Diagnosis not present

## 2022-11-23 ENCOUNTER — Ambulatory Visit: Payer: Medicare PPO | Admitting: Podiatry

## 2023-05-03 DIAGNOSIS — M159 Polyosteoarthritis, unspecified: Secondary | ICD-10-CM | POA: Diagnosis not present

## 2023-05-03 DIAGNOSIS — F4321 Adjustment disorder with depressed mood: Secondary | ICD-10-CM | POA: Diagnosis not present

## 2023-05-03 DIAGNOSIS — Z789 Other specified health status: Secondary | ICD-10-CM | POA: Diagnosis not present

## 2023-05-03 DIAGNOSIS — Z79899 Other long term (current) drug therapy: Secondary | ICD-10-CM | POA: Diagnosis not present

## 2023-05-03 DIAGNOSIS — I10 Essential (primary) hypertension: Secondary | ICD-10-CM | POA: Diagnosis not present

## 2023-05-03 DIAGNOSIS — E663 Overweight: Secondary | ICD-10-CM | POA: Diagnosis not present
